# Patient Record
Sex: Male | Born: 1955 | Race: White | Hispanic: No | Smoking: Current every day smoker
Health system: Southern US, Community
[De-identification: ages and names within clinical notes are randomized; demographics above are authoritative.]

## PROBLEM LIST (undated history)

## (undated) DIAGNOSIS — N400 Enlarged prostate without lower urinary tract symptoms: Secondary | ICD-10-CM

## (undated) DIAGNOSIS — F419 Anxiety disorder, unspecified: Secondary | ICD-10-CM

## (undated) DIAGNOSIS — J45909 Unspecified asthma, uncomplicated: Secondary | ICD-10-CM

## (undated) HISTORY — PX: COLONOSCOPY: SHX174

## (undated) HISTORY — PX: TONSILLECTOMY: SUR1361

## (undated) HISTORY — PX: WISDOM TOOTH EXTRACTION: SHX21

## (undated) HISTORY — PX: CHOLECYSTECTOMY: SHX55

## (undated) HISTORY — PX: OTHER SURGICAL HISTORY: SHX169

---

## 2013-12-29 ENCOUNTER — Ambulatory Visit
Admission: RE | Admit: 2013-12-29 | Discharge: 2013-12-29 | Disposition: A | Discharge status: Home / Self Care | Payer: BC Managed Care – PPO | Source: Ambulatory Visit | Attending: Family Medicine | Admitting: Family Medicine

## 2013-12-29 ENCOUNTER — Other Ambulatory Visit: Payer: Self-pay | Admitting: Family Medicine

## 2013-12-29 DIAGNOSIS — R0789 Other chest pain: Secondary | ICD-10-CM

## 2015-08-23 DIAGNOSIS — R972 Elevated prostate specific antigen [PSA]: Secondary | ICD-10-CM | POA: Diagnosis not present

## 2015-08-31 MED FILL — CITALOPRAM HBR 20 MG TABLET: 20 | 30 days supply | Qty: 30 | Fill #0

## 2015-09-30 MED FILL — CITALOPRAM HBR 20 MG TABLET: 20 | 30 days supply | Qty: 30 | Fill #1

## 2015-10-13 MED FILL — TAMSULOSIN HCL 0.4 MG CAP: 0.4 | 90 days supply | Qty: 90 | Fill #0

## 2015-10-25 MED FILL — CITALOPRAM HBR 20 MG TABLET: 20 | 30 days supply | Qty: 30 | Fill #2

## 2015-10-26 DIAGNOSIS — R972 Elevated prostate specific antigen [PSA]: Secondary | ICD-10-CM | POA: Diagnosis not present

## 2015-10-26 DIAGNOSIS — N5201 Erectile dysfunction due to arterial insufficiency: Secondary | ICD-10-CM | POA: Diagnosis not present

## 2015-10-26 DIAGNOSIS — R350 Frequency of micturition: Secondary | ICD-10-CM | POA: Diagnosis not present

## 2015-10-26 DIAGNOSIS — N401 Enlarged prostate with lower urinary tract symptoms: Secondary | ICD-10-CM | POA: Diagnosis not present

## 2015-11-23 DIAGNOSIS — Z0120 Encounter for dental examination and cleaning without abnormal findings: Secondary | ICD-10-CM | POA: Diagnosis not present

## 2015-12-07 DIAGNOSIS — K1370 Unspecified lesions of oral mucosa: Secondary | ICD-10-CM | POA: Diagnosis not present

## 2015-12-07 MED FILL — CITALOPRAM HBR 20 MG TABLET: 20 | 30 days supply | Qty: 30 | Fill #3

## 2015-12-21 DIAGNOSIS — K146 Glossodynia: Secondary | ICD-10-CM | POA: Diagnosis not present

## 2015-12-21 DIAGNOSIS — B370 Candidal stomatitis: Secondary | ICD-10-CM | POA: Diagnosis not present

## 2015-12-21 MED FILL — NYSTATIN 100,000 UNITS/ML S: 100000 | 6 days supply | Qty: 120 | Fill #0

## 2015-12-23 DIAGNOSIS — R972 Elevated prostate specific antigen [PSA]: Secondary | ICD-10-CM | POA: Diagnosis not present

## 2015-12-26 MED FILL — NYSTATIN 100,000 UNITS/ML S: 100000 | 6 days supply | Qty: 120 | Fill #1

## 2015-12-30 DIAGNOSIS — R351 Nocturia: Secondary | ICD-10-CM | POA: Diagnosis not present

## 2015-12-30 DIAGNOSIS — N5201 Erectile dysfunction due to arterial insufficiency: Secondary | ICD-10-CM | POA: Diagnosis not present

## 2015-12-30 DIAGNOSIS — N401 Enlarged prostate with lower urinary tract symptoms: Secondary | ICD-10-CM | POA: Diagnosis not present

## 2015-12-30 DIAGNOSIS — R972 Elevated prostate specific antigen [PSA]: Secondary | ICD-10-CM | POA: Diagnosis not present

## 2016-01-12 MED FILL — CITALOPRAM HBR 20 MG TABLET: 20 | 30 days supply | Qty: 30 | Fill #4

## 2016-01-24 MED FILL — TAMSULOSIN HCL 0.4 MG CAP: 0.4 | 90 days supply | Qty: 90 | Fill #1

## 2016-02-08 DIAGNOSIS — Z0120 Encounter for dental examination and cleaning without abnormal findings: Secondary | ICD-10-CM | POA: Diagnosis not present
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
## 2016-02-15 MED FILL — CITALOPRAM HBR 20 MG TABLET: 20 | 30 days supply | Qty: 30 | Fill #5

## 2016-02-23 DIAGNOSIS — G5712 Meralgia paresthetica, left lower limb: Secondary | ICD-10-CM | POA: Diagnosis not present

## 2016-02-23 DIAGNOSIS — Z23 Encounter for immunization: Secondary | ICD-10-CM | POA: Diagnosis not present

## 2016-02-23 MED FILL — levoFLOXacin 500 MG TABS: 500 | 3 days supply | Qty: 3 | Fill #0

## 2016-02-23 MED FILL — predniSONE 10 MG TABS: 10 | 8 days supply | Qty: 20 | Fill #0

## 2016-02-24 MED FILL — traMADol HCL 50 MG TABS: 50 | 7 days supply | Qty: 30 | Fill #0

## 2016-02-27 DIAGNOSIS — R972 Elevated prostate specific antigen [PSA]: Secondary | ICD-10-CM | POA: Diagnosis not present

## 2016-02-27 DIAGNOSIS — D075 Carcinoma in situ of prostate: Secondary | ICD-10-CM | POA: Diagnosis not present

## 2016-03-01 MED FILL — predniSONE 20 MG TABS: 20 | 12 days supply | Qty: 24 | Fill #0

## 2016-03-21 MED FILL — CITALOPRAM HBR 20 MG TABLET: 20 | 30 days supply | Qty: 30 | Fill #6

## 2016-04-04 MED FILL — CIALIS 5 MG TABLET: 5 | 30 days supply | Qty: 30 | Fill #0

## 2016-04-23 MED FILL — TAMSULOSIN HCL 0.4 MG CAP: 0.4 | 90 days supply | Qty: 90 | Fill #2

## 2016-04-23 MED FILL — CITALOPRAM HBR 20 MG TABLET: 20 | 30 days supply | Qty: 30 | Fill #7

## 2016-05-09 MED FILL — CIALIS 5 MG TABLET: 5 | 30 days supply | Qty: 30 | Fill #1

## 2016-05-23 MED FILL — CITALOPRAM HBR 20 MG TABLET: 20 | 30 days supply | Qty: 30 | Fill #8

## 2016-06-15 MED FILL — CIALIS 5 MG TABLET: 5 | 30 days supply | Qty: 30 | Fill #2

## 2016-06-25 MED FILL — CITALOPRAM HBR 20 MG TABLET: 20 | 30 days supply | Qty: 30 | Fill #9

## 2016-07-11 DIAGNOSIS — K146 Glossodynia: Secondary | ICD-10-CM | POA: Diagnosis not present

## 2016-07-11 DIAGNOSIS — Z13220 Encounter for screening for lipoid disorders: Secondary | ICD-10-CM | POA: Diagnosis not present

## 2016-07-11 DIAGNOSIS — M79646 Pain in unspecified finger(s): Secondary | ICD-10-CM | POA: Diagnosis not present

## 2016-07-11 DIAGNOSIS — Z0000 Encounter for general adult medical examination without abnormal findings: Secondary | ICD-10-CM | POA: Diagnosis not present

## 2016-07-11 DIAGNOSIS — N529 Male erectile dysfunction, unspecified: Secondary | ICD-10-CM | POA: Diagnosis not present

## 2016-07-11 DIAGNOSIS — N400 Enlarged prostate without lower urinary tract symptoms: Secondary | ICD-10-CM | POA: Diagnosis not present

## 2016-07-11 DIAGNOSIS — Z1211 Encounter for screening for malignant neoplasm of colon: Secondary | ICD-10-CM | POA: Diagnosis not present

## 2016-07-11 DIAGNOSIS — F17200 Nicotine dependence, unspecified, uncomplicated: Secondary | ICD-10-CM | POA: Diagnosis not present

## 2016-07-17 MED FILL — TAMSULOSIN HCL 0.4 MG CAP: 0.4 | 90 days supply | Qty: 90 | Fill #3

## 2016-07-17 MED FILL — CIALIS 5 MG TABLET: 5 | 30 days supply | Qty: 30 | Fill #3

## 2016-07-25 MED FILL — CITALOPRAM HBR 20 MG TABLET: 20 | 30 days supply | Qty: 30 | Fill #10

## 2016-07-30 DIAGNOSIS — N5201 Erectile dysfunction due to arterial insufficiency: Secondary | ICD-10-CM | POA: Diagnosis not present

## 2016-07-30 DIAGNOSIS — R972 Elevated prostate specific antigen [PSA]: Secondary | ICD-10-CM | POA: Diagnosis not present

## 2016-07-30 DIAGNOSIS — R350 Frequency of micturition: Secondary | ICD-10-CM | POA: Diagnosis not present

## 2016-07-30 DIAGNOSIS — N401 Enlarged prostate with lower urinary tract symptoms: Secondary | ICD-10-CM | POA: Diagnosis not present

## 2016-08-02 DIAGNOSIS — Z0000 Encounter for general adult medical examination without abnormal findings: Secondary | ICD-10-CM | POA: Diagnosis not present

## 2016-08-02 DIAGNOSIS — M79646 Pain in unspecified finger(s): Secondary | ICD-10-CM | POA: Diagnosis not present

## 2016-08-02 DIAGNOSIS — Z13220 Encounter for screening for lipoid disorders: Secondary | ICD-10-CM | POA: Diagnosis not present

## 2016-08-22 MED FILL — CITALOPRAM HBR 20 MG TABLET: 20 | 30 days supply | Qty: 30 | Fill #11

## 2016-08-22 MED FILL — CIALIS 5 MG TABLET: 5 | 30 days supply | Qty: 30 | Fill #4

## 2016-09-24 MED FILL — CIALIS 5 MG TABLET: 5 | 30 days supply | Qty: 30 | Fill #5

## 2016-09-26 MED FILL — CITALOPRAM HBR 20 MG TABLET: 20 | 30 days supply | Qty: 30 | Fill #0

## 2016-10-23 MED FILL — CIALIS 5 MG TABLET: 5 | 30 days supply | Qty: 30 | Fill #6

## 2016-10-23 MED FILL — TAMSULOSIN HCL 0.4 MG CAP: 0.4 | 90 days supply | Qty: 90 | Fill #0

## 2016-10-23 MED FILL — CITALOPRAM HBR 20 MG TABLET: 20 | 30 days supply | Qty: 30 | Fill #1

## 2016-11-26 DIAGNOSIS — N5201 Erectile dysfunction due to arterial insufficiency: Secondary | ICD-10-CM | POA: Diagnosis not present

## 2016-11-27 MED FILL — CIALIS 5 MG TABLET: 5 | 30 days supply | Qty: 30 | Fill #0

## 2016-11-27 MED FILL — CITALOPRAM HBR 20 MG TABLET: 20 | 30 days supply | Qty: 30 | Fill #0

## 2016-12-31 MED FILL — TADALAFIL 5 MG TABS: 5 | 30 days supply | Qty: 30 | Fill #1

## 2016-12-31 MED FILL — CITALOPRAM HBR 20 MG TABLET: 20 | 30 days supply | Qty: 30 | Fill #1

## 2017-01-22 MED FILL — TAMSULOSIN HCL 0.4 MG CAP: 0.4 | 90 days supply | Qty: 90 | Fill #1

## 2017-02-05 MED FILL — TADALAFIL 5 MG TABS: 5 | 30 days supply | Qty: 30 | Fill #2

## 2017-02-05 MED FILL — CITALOPRAM HBR 20 MG TABLET: 20 | 30 days supply | Qty: 30 | Fill #2

## 2017-03-11 MED FILL — CITALOPRAM HBR 20 MG TABLET: 20 | 30 days supply | Qty: 30 | Fill #3

## 2017-03-11 MED FILL — TADALAFIL 5 MG TABS: 5 | 30 days supply | Qty: 30 | Fill #3

## 2017-04-17 MED FILL — CITALOPRAM HBR 20 MG TABLET: 20 | 30 days supply | Qty: 30 | Fill #4

## 2017-04-17 MED FILL — TAMSULOSIN HCL 0.4 MG CAP: 0.4 | 90 days supply | Qty: 90 | Fill #2

## 2017-05-20 MED FILL — CITALOPRAM HBR 20 MG TABLET: 20 | 30 days supply | Qty: 30 | Fill #5

## 2017-05-22 DIAGNOSIS — N5201 Erectile dysfunction due to arterial insufficiency: Secondary | ICD-10-CM | POA: Diagnosis not present

## 2017-05-22 DIAGNOSIS — N401 Enlarged prostate with lower urinary tract symptoms: Secondary | ICD-10-CM | POA: Diagnosis not present

## 2017-05-22 DIAGNOSIS — R350 Frequency of micturition: Secondary | ICD-10-CM | POA: Diagnosis not present

## 2017-05-22 DIAGNOSIS — R972 Elevated prostate specific antigen [PSA]: Secondary | ICD-10-CM | POA: Diagnosis not present

## 2017-05-22 MED FILL — FINASTERIDE 5 MG TABLET: 5 | 90 days supply | Qty: 90 | Fill #0

## 2017-06-24 MED FILL — CITALOPRAM HBR 20 MG TABLET: 20 | 30 days supply | Qty: 30 | Fill #6

## 2017-07-22 DIAGNOSIS — Z1159 Encounter for screening for other viral diseases: Secondary | ICD-10-CM | POA: Diagnosis not present

## 2017-07-22 DIAGNOSIS — N400 Enlarged prostate without lower urinary tract symptoms: Secondary | ICD-10-CM | POA: Diagnosis not present

## 2017-07-22 DIAGNOSIS — Z1211 Encounter for screening for malignant neoplasm of colon: Secondary | ICD-10-CM | POA: Diagnosis not present

## 2017-07-22 DIAGNOSIS — F17200 Nicotine dependence, unspecified, uncomplicated: Secondary | ICD-10-CM | POA: Diagnosis not present

## 2017-07-22 DIAGNOSIS — Z13220 Encounter for screening for lipoid disorders: Secondary | ICD-10-CM | POA: Diagnosis not present

## 2017-07-22 DIAGNOSIS — K146 Glossodynia: Secondary | ICD-10-CM | POA: Diagnosis not present

## 2017-07-22 DIAGNOSIS — M79646 Pain in unspecified finger(s): Secondary | ICD-10-CM | POA: Diagnosis not present

## 2017-07-22 DIAGNOSIS — Z0000 Encounter for general adult medical examination without abnormal findings: Secondary | ICD-10-CM | POA: Diagnosis not present

## 2017-07-22 DIAGNOSIS — N529 Male erectile dysfunction, unspecified: Secondary | ICD-10-CM | POA: Diagnosis not present

## 2017-07-22 MED FILL — MELOXICAM 15 MG TABLET: 15 | 90 days supply | Qty: 90 | Fill #0

## 2017-07-28 MED FILL — CITALOPRAM HBR 20 MG TABLET: 20 | 30 days supply | Qty: 30 | Fill #7

## 2017-07-29 MED FILL — TAMSULOSIN HCL 0.4 MG CAP: 0.4 | 90 days supply | Qty: 90 | Fill #0

## 2017-08-28 MED FILL — CITALOPRAM HBR 20 MG TABLET: 20 | 30 days supply | Qty: 30 | Fill #8

## 2017-09-11 ENCOUNTER — Encounter: Payer: Self-pay | Admitting: Gastroenterology

## 2017-09-28 MED FILL — CITALOPRAM HBR 20 MG TABLET: 20 | 30 days supply | Qty: 30 | Fill #9

## 2017-10-28 MED FILL — TAMSULOSIN HCL 0.4 MG CAP: 0.4 | 90 days supply | Qty: 90 | Fill #0

## 2017-10-28 MED FILL — CITALOPRAM HBR 20 MG TABLET: 20 | 30 days supply | Qty: 30 | Fill #10

## 2017-11-04 ENCOUNTER — Ambulatory Visit (AMBULATORY_SURGERY_CENTER): Payer: Self-pay

## 2017-11-04 VITALS — Ht 68.0 in | Wt 166.4 lb

## 2017-11-04 DIAGNOSIS — Z1211 Encounter for screening for malignant neoplasm of colon: Secondary | ICD-10-CM

## 2017-11-04 MED ORDER — NA SULFATE-K SULFATE-MG SULF 17.5-3.13-1.6 GM/177ML PO SOLN: 1.0000 | Freq: Once | ORAL | 0 refills | Status: AC

## 2017-11-04 NOTE — Progress Notes (Signed)
No egg or soy allergy known to patient  No issues with past sedation with any surgeries  or procedures, no intubation problems  No diet pills per patient No home 02 use per patient  No blood thinners per patient  Pt denies issues with constipation  No A fib or A flutter  EMMI video sent to pt's e mail  

## 2017-11-06 ENCOUNTER — Encounter: Payer: Self-pay | Admitting: Gastroenterology

## 2017-11-12 MED FILL — MELOXICAM 15 MG TABLET: 15 | 90 days supply | Qty: 90 | Fill #1

## 2017-11-15 MED FILL — SUPREP BOWEL PREP KIT: 17.5-3.13-1 | 1 days supply | Qty: 354 | Fill #0

## 2017-11-18 ENCOUNTER — Ambulatory Visit (AMBULATORY_SURGERY_CENTER): Payer: 59 | Admitting: Gastroenterology

## 2017-11-18 ENCOUNTER — Encounter: Payer: Self-pay | Admitting: Gastroenterology

## 2017-11-18 VITALS — BP 114/63 | HR 58 | Temp 98.9°F | Resp 18 | Ht 68.0 in | Wt 166.0 lb

## 2017-11-18 DIAGNOSIS — D122 Benign neoplasm of ascending colon: Secondary | ICD-10-CM | POA: Diagnosis not present

## 2017-11-18 DIAGNOSIS — Z1211 Encounter for screening for malignant neoplasm of colon: Secondary | ICD-10-CM

## 2017-11-18 DIAGNOSIS — N400 Enlarged prostate without lower urinary tract symptoms: Secondary | ICD-10-CM | POA: Diagnosis not present

## 2017-11-18 MED ORDER — SODIUM CHLORIDE 0.9 % IV SOLN: 500.0000 mL | Freq: Once | INTRAVENOUS | Status: DC

## 2017-11-18 NOTE — Op Note (Signed)
[place]  Patient Name: Keith Beard Procedure Date: 11/18/2017 8:40 AM MRN: [id number] Endoscopist: Remo Lipps P. Havery Moros , MD Age: 62 Referring MD:  Date of Birth: [birth year] Gender: Male Account #: [id number] Procedure:                Colonoscopy Indications:              Screening for colorectal malignant neoplasm Medicines:                Monitored Anesthesia Care Procedure:                Pre-Anesthesia Assessment:                           - Prior to the procedure, a History and Physical                            was performed, and patient medications and                            allergies were reviewed. The patient's tolerance of                            previous anesthesia was also reviewed. The risks                            and benefits of the procedure and the sedation                            options and risks were discussed with the patient.                            All questions were answered, and informed consent                            was obtained. Prior Anticoagulants: The patient has                            taken no previous anticoagulant or antiplatelet                            agents. ASA Grade Assessment: II - A patient with                            mild systemic disease. After reviewing the risks                            and benefits, the patient was deemed in                            satisfactory condition to undergo the procedure.                           After obtaining informed consent, the colonoscope  was passed under direct vision. Throughout the                            procedure, the patient's blood pressure, pulse, and                            oxygen saturations were monitored continuously. The                            Colonoscope was introduced through the anus and                            advanced to the the cecum, identified by                            appendiceal orifice  and ileocecal valve. The                            colonoscopy was performed without difficulty. The                            patient tolerated the procedure well. The quality                            of the bowel preparation was adequate. The                            ileocecal valve, appendiceal orifice, and rectum                            were photographed. Scope In: 8:54:43 AM Scope Out: 9:15:13 AM Scope Withdrawal Time: 0 hours 17 minutes 45 seconds  Total Procedure Duration: 0 hours 20 minutes 30 seconds  Findings:                 Skin tags were found on perianal exam.                           Many small-mouthed diverticula were found in the                            left colon.                           A 5 mm polyp was found in the ascending colon. The                            polyp was flat. The polyp was removed with a cold                            snare. Resection and retrieval were complete.                           Internal hemorrhoids were found during retroflexion.  The exam was otherwise without abnormality. Complications:            No immediate complications. Estimated blood loss:                            Minimal. Estimated Blood Loss:     Estimated blood loss was minimal. Impression:               - Perianal skin tags found on perianal exam.                           - Diverticulosis in the left colon.                           - One 5 mm polyp in the ascending colon, removed                            with a cold snare. Resected and retrieved.                           - Internal hemorrhoids.                           - The examination was otherwise normal. Recommendation:           - Patient has a contact number available for                            emergencies. The signs and symptoms of potential                            delayed complications were discussed with the                            patient. Return to normal  activities tomorrow.                            Written discharge instructions were provided to the                            patient.                           - Resume previous diet.                           - Repeat colonoscopy date to be determined after                            pending pathology results are reviewed for                            surveillance.                           - Continue present medications. Remo Lipps P. Durant Scibilia, MD 11/18/2017 9:18:26 AM This report has been signed electronically.

## 2017-11-18 NOTE — Progress Notes (Signed)
Called to room to assist during endoscopic procedure.  Patient ID and intended procedure confirmed with present staff. Received instructions for my participation in the procedure from the performing physician.  

## 2017-11-18 NOTE — Progress Notes (Signed)
Report to PACU, RN, vss, BBS= Clear.  

## 2017-11-18 NOTE — Patient Instructions (Signed)
YOU HAD AN ENDOSCOPIC PROCEDURE TODAY AT THE  ENDOSCOPY CENTER:   Refer to the procedure report that was given to you for any specific questions about what was found during the examination.  If the procedure report does not answer your questions, please call your gastroenterologist to clarify.  If you requested that your care partner not be given the details of your procedure findings, then the procedure report has been included in a sealed envelope for you to review at your convenience later.  YOU SHOULD EXPECT: Some feelings of bloating in the abdomen. Passage of more gas than usual.  Walking can help get rid of the air that was put into your GI tract during the procedure and reduce the bloating. If you had a lower endoscopy (such as a colonoscopy or flexible sigmoidoscopy) you may notice spotting of blood in your stool or on the toilet paper. If you underwent a bowel prep for your procedure, you may not have a normal bowel movement for a few days.  Please Note:  You might notice some irritation and congestion in your nose or some drainage.  This is from the oxygen used during your procedure.  There is no need for concern and it should clear up in a day or so.  SYMPTOMS TO REPORT IMMEDIATELY:   Following lower endoscopy (colonoscopy or flexible sigmoidoscopy):  Excessive amounts of blood in the stool  Significant tenderness or worsening of abdominal pains  Swelling of the abdomen that is new, acute  Fever of 100F or higher  Please see handouts on polyps, diverticulosis, and hemorrhoids.  For urgent or emergent issues, a gastroenterologist can be reached at any hour by calling [phone].   DIET:  We do recommend a small meal at first, but then you may proceed to your regular diet.  Drink plenty of fluids but you should avoid alcoholic beverages for 24 hours.  ACTIVITY:  You should plan to take it easy for the rest of today and you should NOT DRIVE or use heavy machinery until  tomorrow (because of the sedation medicines used during the test).    FOLLOW UP: Our staff will call the number listed on your records the next business day following your procedure to check on you and address any questions or concerns that you may have regarding the information given to you following your procedure. If we do not reach you, we will leave a message.  However, if you are feeling well and you are not experiencing any problems, there is no need to return our call.  We will assume that you have returned to your regular daily activities without incident.  If any biopsies were taken you will be contacted by phone or by letter within the next 1-3 weeks.  Please call us at [phone] if you have not heard about the biopsies in 3 weeks.    SIGNATURES/CONFIDENTIALITY: You and/or your care partner have signed paperwork which will be entered into your electronic medical record.  These signatures attest to the fact that that the information above on your After Visit Summary has been reviewed and is understood.  Full responsibility of the confidentiality of this discharge information lies with you and/or your care-partner.   Thank you for allowing us to provide your healthcare today.  

## 2017-11-19 ENCOUNTER — Telehealth: Payer: Self-pay

## 2017-11-19 ENCOUNTER — Telehealth: Payer: Self-pay | Admitting: *Deleted

## 2017-11-19 NOTE — Telephone Encounter (Signed)
Left message on f/u call 

## 2017-11-19 NOTE — Telephone Encounter (Signed)
Attempted to reach pt. With follow-up call following endoscopic procedure 11/18/2017.  LM on pt. Voice mail.  Will try to reach pt. Again later today.

## 2017-11-20 ENCOUNTER — Encounter: Payer: Self-pay | Admitting: Gastroenterology

## 2017-11-22 ENCOUNTER — Encounter (HOSPITAL_COMMUNITY): Payer: Self-pay | Admitting: *Deleted

## 2017-11-22 ENCOUNTER — Other Ambulatory Visit: Payer: Self-pay

## 2017-11-22 ENCOUNTER — Emergency Department (HOSPITAL_COMMUNITY)
Admission: EM | Admit: 2017-11-22 | Discharge: 2017-11-22 | Disposition: A | Discharge status: Home / Self Care | Payer: 59 | Attending: Emergency Medicine | Admitting: Emergency Medicine

## 2017-11-22 DIAGNOSIS — F17210 Nicotine dependence, cigarettes, uncomplicated: Secondary | ICD-10-CM | POA: Diagnosis not present

## 2017-11-22 DIAGNOSIS — R339 Retention of urine, unspecified: Secondary | ICD-10-CM | POA: Insufficient documentation

## 2017-11-22 DIAGNOSIS — R1030 Lower abdominal pain, unspecified: Secondary | ICD-10-CM | POA: Insufficient documentation

## 2017-11-22 DIAGNOSIS — R8271 Bacteriuria: Secondary | ICD-10-CM | POA: Diagnosis not present

## 2017-11-22 DIAGNOSIS — Z79899 Other long term (current) drug therapy: Secondary | ICD-10-CM | POA: Insufficient documentation

## 2017-11-22 DIAGNOSIS — R338 Other retention of urine: Secondary | ICD-10-CM | POA: Diagnosis not present

## 2017-11-22 LAB — URINALYSIS, ROUTINE W REFLEX MICROSCOPIC
Bilirubin Urine: NEGATIVE
Glucose, UA: NEGATIVE mg/dL
Hgb urine dipstick: NEGATIVE
Ketones, ur: NEGATIVE mg/dL
Leukocytes, UA: NEGATIVE
Nitrite: NEGATIVE
Protein, ur: NEGATIVE mg/dL
Specific Gravity, Urine: 1.014 (ref 1.005–1.030)
pH: 5 (ref 5.0–8.0)

## 2017-11-22 LAB — CBC WITH DIFFERENTIAL/PLATELET
Basophils Absolute: 0 10*3/uL (ref 0.0–0.1)
Basophils Relative: 0 %
Eosinophils Absolute: 0.1 10*3/uL (ref 0.0–0.7)
Eosinophils Relative: 1 %
HCT: 41.5 % (ref 39.0–52.0)
Hemoglobin: 14.1 g/dL (ref 13.0–17.0)
Lymphocytes Relative: 19 %
Lymphs Abs: 1.7 10*3/uL (ref 0.7–4.0)
MCH: 31.8 pg (ref 26.0–34.0)
MCHC: 34 g/dL (ref 30.0–36.0)
MCV: 93.5 fL (ref 78.0–100.0)
Monocytes Absolute: 0.5 10*3/uL (ref 0.1–1.0)
Monocytes Relative: 5 %
Neutro Abs: 6.5 10*3/uL (ref 1.7–7.7)
Neutrophils Relative %: 75 %
Platelets: 263 10*3/uL (ref 150–400)
RBC: 4.44 MIL/uL (ref 4.22–5.81)
RDW: 13.6 % (ref 11.5–15.5)
WBC: 8.7 10*3/uL (ref 4.0–10.5)

## 2017-11-22 LAB — BASIC METABOLIC PANEL
Anion gap: 7 (ref 5–15)
BUN: 23 mg/dL — ABNORMAL HIGH (ref 6–20)
CO2: 24 mmol/L (ref 22–32)
Calcium: 9.2 mg/dL (ref 8.9–10.3)
Chloride: 110 mmol/L (ref 98–111)
Creatinine, Ser: 0.84 mg/dL (ref 0.61–1.24)
GFR calc Af Amer: >60 mL/min (ref >60)
GFR calc non Af Amer: >60 mL/min (ref >60)
Glucose, Bld: 105 mg/dL — ABNORMAL HIGH (ref 70–99)
Potassium: 4.5 mmol/L (ref 3.5–5.1)
Sodium: 141 mmol/L (ref 135–145)

## 2017-11-22 NOTE — Discharge Instructions (Signed)
You have decided not to keep the foley in. Please try to urinate frequently.   See your urologist next week   Return to ER if you are unable to urinate, blood clots in the urine. If you have urinary retention again, we likely will need to put a foley and may need to leave it in for several days.

## 2017-11-22 NOTE — ED Notes (Signed)
Pt given water for fluid challenge 

## 2017-11-22 NOTE — ED Provider Notes (Signed)
[facility] DEPT Provider Note   CSN: [id number] Arrival date & time: 11/22/17  0640     History   Chief Complaint Chief Complaint  Patient presents with  . Urinary Retention    HPI Keith Beard is a 62 y.o. male here with urinary retention.  Patient has a history of BPH and follows up with Dr. Diona Beard.  Since last night, he was unable to urinate.  He last urinated around 9 PM but try to get up at midnight was unable to urinate.  States that he has severe lower abdominal pain.  Denies any fevers or chills or vomiting.  Patient states that he never had a Foley catheter in the past.   The history is provided by the patient.    History reviewed. No pertinent past medical history.  There are no active problems to display for this patient.   Past Surgical History:  Procedure Laterality Date  . CHOLECYSTECTOMY    . TONSILLECTOMY    . WISDOM TOOTH EXTRACTION     20 years ago        Home Medications    Prior to Admission medications   Medication Sig Start Date End Date Taking? Authorizing Provider  citalopram (CELEXA) 20 MG tablet  10/28/17   [provider]  meloxicam (MOBIC) 7.5 MG tablet Take 7.5 mg by mouth daily.    [provider]  Multiple Vitamin (MULTIVITAMIN) tablet Take 1 tablet by mouth daily.    [provider]  tadalafil (CIALIS) 10 MG tablet Take 10 mg by mouth daily as needed for erectile dysfunction.    [provider]  tamsulosin (FLOMAX) 0.4 MG CAPS capsule  10/28/17   [provider]  vitamin B-12 (CYANOCOBALAMIN) 100 MCG tablet Take 100 mcg by mouth daily.    [provider]  vitamin C (ASCORBIC ACID) 500 MG tablet Take 500 mg by mouth daily.    [provider]  vitamin E 400 UNIT capsule Take 400 Units by mouth daily.    [provider]    Family History Family History  Problem Relation Age of Onset  . Hypertension Father   . Colon cancer Neg  Hx   . Colon polyps Neg Hx   . Esophageal cancer Neg Hx   . Stomach cancer Neg Hx   . Rectal cancer Neg Hx     Social History Social History   Tobacco Use  . Smoking status: Current Every Day Smoker    Packs/day: 1.00  . Smokeless tobacco: Never Used  Substance Use Topics  . Alcohol use: Yes    Alcohol/week: 4.0 - 5.0 standard drinks    Types: 4 - 5 Cans of beer per week  . Drug use: Not on file     Allergies   Patient has no known allergies.   Review of Systems Review of Systems  Gastrointestinal: Positive for abdominal pain.  Genitourinary: Positive for difficulty urinating.  All other systems reviewed and are negative.    Physical Exam Updated Vital Signs BP 137/87   Pulse 90   Temp (!) 97.1 F (36.2 C) (Oral)   Resp 16   SpO2 99%   Physical Exam  Constitutional: He is oriented to person, place, and time. He appears well-developed.  Unable   HENT:  Head: Normocephalic.  Eyes: Pupils are equal, round, and reactive to light. Conjunctivae and EOM are normal.  Neck: Normal range of motion. Neck supple.  Cardiovascular: Normal rate, regular rhythm and normal  heart sounds.  Pulmonary/Chest: Effort normal and breath sounds normal.  Abdominal: Soft.  Suprapubic tenderness, + enlarged bladder   Musculoskeletal: Normal range of motion.  Neurological: He is alert and oriented to person, place, and time.  Skin: Skin is warm.  Psychiatric: He has a normal mood and affect.  Nursing note and vitals reviewed.    ED Treatments / Results  Labs (all labs ordered are listed, but only abnormal results are displayed) Labs Reviewed  URINE CULTURE  URINALYSIS, ROUTINE W REFLEX MICROSCOPIC  CBC WITH DIFFERENTIAL/PLATELET  BASIC METABOLIC PANEL    EKG None  Radiology No results found.  Procedures Procedures (including critical care time)  Medications Ordered in ED Medications - No data to display   Initial Impression / Assessment and Plan / ED Course  I  have reviewed the triage vital signs and the nursing notes.  Pertinent labs & imaging results that were available during my care of the patient were reviewed by me and considered in my medical decision making (see chart for details).     Keith Beard is a 62 y.o. male here with urinary retention. Likely from BPH. Will get bladder scan and if its greater than 250 cc, will place foley. Will check chemistry as well.   9:30 AM Foley placed. Around 1 L came out. UA and chemistry unremarkable. Patient requests foley removal as it is very uncomfortable. I told him that he will likely go back into retention from his BPH. However, he is clear that he wants it removed and understand risks and benefits. Will do voiding trial.   10:49 AM Tolerated PO fluids. Able to void about 50 cc, slightly blood tinged (expected after foley catheter removal). He still request that no foley be placed and he will see Dr. Marthann Beard in the office. Told him that if he is unable to urinate, he will need to return to the ED and foley will need to be placed again.    Final Clinical Impressions(s) / ED Diagnoses   Final diagnoses:  None    ED Discharge Orders    None       Drenda Freeze, MD 11/22/17 1050

## 2017-11-22 NOTE — ED Triage Notes (Signed)
Pt says since about 9pm last night he has only urinated a very small amount

## 2017-11-23 LAB — URINE CULTURE: Culture: NO GROWTH

## 2017-11-27 DIAGNOSIS — N5201 Erectile dysfunction due to arterial insufficiency: Secondary | ICD-10-CM | POA: Diagnosis not present

## 2017-11-27 DIAGNOSIS — R338 Other retention of urine: Secondary | ICD-10-CM | POA: Diagnosis not present

## 2017-11-27 DIAGNOSIS — R350 Frequency of micturition: Secondary | ICD-10-CM | POA: Diagnosis not present

## 2017-11-27 DIAGNOSIS — R972 Elevated prostate specific antigen [PSA]: Secondary | ICD-10-CM | POA: Diagnosis not present

## 2017-11-27 DIAGNOSIS — N401 Enlarged prostate with lower urinary tract symptoms: Secondary | ICD-10-CM | POA: Diagnosis not present

## 2017-12-04 MED FILL — CITALOPRAM HBR 20 MG TABLET: 20 | 30 days supply | Qty: 30 | Fill #0

## 2018-01-03 MED FILL — CITALOPRAM HBR 20 MG TABLET: 20 | 30 days supply | Qty: 30 | Fill #1

## 2018-01-30 MED FILL — TAMSULOSIN HCL 0.4 MG CAP: 0.4 | 90 days supply | Qty: 90 | Fill #1

## 2018-01-30 MED FILL — CITALOPRAM HBR 20 MG TABLET: 20 | 30 days supply | Qty: 30 | Fill #2

## 2018-03-04 MED FILL — CITALOPRAM HBR 20 MG TABLET: 20 | 30 days supply | Qty: 30 | Fill #3

## 2018-03-20 DIAGNOSIS — Z23 Encounter for immunization: Secondary | ICD-10-CM | POA: Diagnosis not present

## 2018-04-07 MED FILL — CITALOPRAM HBR 20 MG TABLET: 20 | 30 days supply | Qty: 30 | Fill #4

## 2018-04-08 MED FILL — DICLOFENAC SODIUM 1 % GEL: 1 | 37 days supply | Qty: 300 | Fill #0

## 2018-05-01 MED FILL — TAMSULOSIN HCL 0.4 MG CAP: 0.4 | 90 days supply | Qty: 90 | Fill #2

## 2018-05-05 MED FILL — CITALOPRAM HBR 20 MG TABLET: 20 | 30 days supply | Qty: 30 | Fill #5

## 2018-06-04 MED FILL — CITALOPRAM HBR 20 MG TABLET: 20 | 30 days supply | Qty: 30 | Fill #6

## 2018-06-04 MED FILL — MELOXICAM 15 MG TABLET: 15 | 90 days supply | Qty: 90 | Fill #2

## 2018-07-07 MED FILL — CITALOPRAM HBR 20 MG TABLET: 20 | 30 days supply | Qty: 30 | Fill #7

## 2018-07-31 DIAGNOSIS — F17200 Nicotine dependence, unspecified, uncomplicated: Secondary | ICD-10-CM | POA: Diagnosis not present

## 2018-07-31 DIAGNOSIS — Z13220 Encounter for screening for lipoid disorders: Secondary | ICD-10-CM | POA: Diagnosis not present

## 2018-07-31 DIAGNOSIS — N400 Enlarged prostate without lower urinary tract symptoms: Secondary | ICD-10-CM | POA: Diagnosis not present

## 2018-07-31 DIAGNOSIS — Z0000 Encounter for general adult medical examination without abnormal findings: Secondary | ICD-10-CM | POA: Diagnosis not present

## 2018-07-31 DIAGNOSIS — M79646 Pain in unspecified finger(s): Secondary | ICD-10-CM | POA: Diagnosis not present

## 2018-07-31 DIAGNOSIS — Z1159 Encounter for screening for other viral diseases: Secondary | ICD-10-CM | POA: Diagnosis not present

## 2018-07-31 DIAGNOSIS — Z1211 Encounter for screening for malignant neoplasm of colon: Secondary | ICD-10-CM | POA: Diagnosis not present

## 2018-07-31 DIAGNOSIS — K146 Glossodynia: Secondary | ICD-10-CM | POA: Diagnosis not present

## 2018-07-31 DIAGNOSIS — N529 Male erectile dysfunction, unspecified: Secondary | ICD-10-CM | POA: Diagnosis not present

## 2018-08-13 MED FILL — CITALOPRAM HBR 20 MG TABLET: 20 | 30 days supply | Qty: 30 | Fill #8

## 2018-08-13 MED FILL — TAMSULOSIN HCL 0.4 MG CAP: 0.4 | 90 days supply | Qty: 90 | Fill #3

## 2018-08-19 DIAGNOSIS — Z0000 Encounter for general adult medical examination without abnormal findings: Secondary | ICD-10-CM | POA: Diagnosis not present

## 2018-08-19 DIAGNOSIS — Z1159 Encounter for screening for other viral diseases: Secondary | ICD-10-CM | POA: Diagnosis not present

## 2018-08-19 DIAGNOSIS — Z13220 Encounter for screening for lipoid disorders: Secondary | ICD-10-CM | POA: Diagnosis not present

## 2018-09-10 MED FILL — CITALOPRAM HBR 20 MG TABLET: 20 | 30 days supply | Qty: 30 | Fill #9

## 2018-11-11 MED FILL — CITALOPRAM HBR 20 MG TABLET: 20 | 30 days supply | Qty: 30 | Fill #0

## 2018-11-14 MED FILL — TAMSULOSIN HCL 0.4 MG CAP: 0.4 | 90 days supply | Qty: 90 | Fill #0

## 2018-12-16 MED FILL — AMOX-CLAV 500-125 MG TABLET: 500-125 | 10 days supply | Qty: 20 | Fill #0

## 2018-12-16 MED FILL — CITALOPRAM HBR 20 MG TABLET: 20 | 30 days supply | Qty: 30 | Fill #0

## 2018-12-20 MED FILL — FLUARIX QUADRIVALENT 0.5 ML: 0.5 | 1 days supply | Qty: 1 | Fill #0

## 2019-01-14 MED FILL — CITALOPRAM HBR 20 MG TABLET: 20 | 30 days supply | Qty: 30 | Fill #1

## 2019-02-13 MED FILL — CITALOPRAM HBR 20 MG TABLET: 20 | 30 days supply | Qty: 30 | Fill #2

## 2019-02-13 MED FILL — TAMSULOSIN HCL 0.4 MG CAP: 0.4 | 90 days supply | Qty: 90 | Fill #0

## 2019-04-13 MED FILL — CITALOPRAM HBR 20 MG TABLET: 20 | 30 days supply | Qty: 30 | Fill #4

## 2019-04-16 DIAGNOSIS — M545 Low back pain: Secondary | ICD-10-CM | POA: Diagnosis not present

## 2019-04-16 MED FILL — CYCLOBENZAPRINE HCL 10 MG T: 10 | 15 days supply | Qty: 15 | Fill #0

## 2019-04-22 ENCOUNTER — Encounter: Payer: Self-pay | Admitting: Physical Therapy

## 2019-04-22 ENCOUNTER — Ambulatory Visit: Payer: 59 | Attending: Family Medicine | Admitting: Physical Therapy

## 2019-04-22 ENCOUNTER — Other Ambulatory Visit: Payer: Self-pay

## 2019-04-22 DIAGNOSIS — M545 Low back pain, unspecified: Secondary | ICD-10-CM

## 2019-04-22 DIAGNOSIS — M62830 Muscle spasm of back: Secondary | ICD-10-CM | POA: Diagnosis not present

## 2019-04-23 ENCOUNTER — Encounter: Payer: Self-pay | Admitting: Physical Therapy

## 2019-04-23 NOTE — Therapy (Signed)
[place], [place], [address] Phone: [phone]   Fax:  [phone]  Physical Therapy Evaluation  Patient Details  Name: Keith Beard MRN: [id number] Date of Birth: 03/04/1956 Referring Provider (PT): Dr Eual Fines    Encounter Date: 04/22/2019  PT End of Session - 04/23/19 0727    Visit Number  1    Number of Visits  12    Date for PT Re-Evaluation  06/04/19    PT Start Time  [id number]    PT Stop Time  1459    PT Time Calculation (min)  44 min    Activity Tolerance  Patient tolerated treatment well    Behavior During Therapy  [facility] for tasks assessed/performed       History reviewed. No pertinent past medical history.  Past Surgical History:  Procedure Laterality Date  . CHOLECYSTECTOMY    . TONSILLECTOMY    . WISDOM TOOTH EXTRACTION     20 years ago    There were no vitals filed for this visit.   Subjective Assessment - 04/22/19 1419    Subjective  Patient had an acute onset of lower back pain about 5 weeks ago. He has had this problem before but int he past it has gone away.    Pertinent History  smoker    How long can you sit comfortably?  Stiffens later in the day    How long can you stand comfortably?  standing isn;t bad    How long can you walk comfortably?  can feel it when he walks; faovers the left side    Diagnostic tests  Nothing    Patient Stated Goals  to have less pain    Currently in Pain?  Yes    Pain Score  6     Pain Location  Back    Pain Orientation  Left    Pain Descriptors / Indicators  Aching    Pain Type  Chronic pain    Pain Radiating Towards  pain into the buttock    Pain Onset  More than a month ago    Pain Frequency  Constant    Aggravating Factors   as the day goes on; sleeping position    Pain Relieving Factors  positioning;    Effect of Pain on Daily Activities  pain as the day goes on                    Objective measurements completed  on examination: See above findings.      [place] [facility] PT Treatment/Exercise - 04/23/19 0001      Lumbar Exercises: Stretches   Piriformis Stretch  3 reps;20 seconds    Other Lumbar Stretch Exercise  tennis ball trigger point release       Lumbar Exercises: Supine   AB Set Limitations  reviewed breathing with gym activity     Clam Limitations  x20 yellow to decrease post needle soreness       Manual Therapy   Manual therapy comments  40% limitation on FOTO        Trigger Point Dry Needling - 04/23/19 0001    Consent Given?  Yes    Education Handout Provided  Yes    Muscles Treated Back/Hip  Gluteus medius    Dry Needling Comments  3 spots using a 30x75 needle inleft glut medius     Gluteus Medius Response  Twitch response elicited  PT Education - 04/23/19 0726    Education Details  reviewed HEP and symptom management    Person(s) Educated  Patient    Methods  Explanation;Demonstration;Tactile cues;Verbal cues    Comprehension  Verbalized understanding;Returned demonstration;Verbal cues required;Tactile cues required       PT Short Term Goals - 04/23/19 0811      PT SHORT TERM GOAL #1   Title  Patient will perfrom full lumbar extension without radiating pain into his buttock    Time  3    Period  Weeks    Status  New    Target Date  05/14/19      PT SHORT TERM GOAL #2   Title  Patient will increase left gross LE strength to 5/5    Time  3    Period  Weeks    Status  New    Target Date  05/14/19      PT SHORT TERM GOAL #3   Title  Patient will demonstrate full end range left hip flexion    Time  3    Period  Weeks    Status  New    Target Date  05/14/19        PT Long Term Goals - 04/23/19 0823      PT LONG TERM GOAL #1   Title  Patient will sleep in whatever position he chooses without lower back pain    Baseline  can only sleep on his left side    Time  6    Period  Weeks    Status  New    Target Date  06/04/19      PT LONG TERM GOAL  #2   Title  Patient will stand at work without increased low back pain    Time  6    Period  Weeks    Status  New    Target Date  06/04/19      PT LONG TERM GOAL #3   Title  Patient will demonstrate a 26% limitation on FOTO    Baseline  40%    Time  6    Period  Weeks    Status  New    Target Date  06/04/19             Plan - 04/23/19 0727    Clinical Impression Statement  Patient is a 64 year old male with left sided lower back pain. He has increased pain with extension and imporved pain with fleixon although he feels pulling at end range flexion. He has a large spasm in hi sleft glut medius/max are. He has mild weakness compared ot the right side. He has pain with end range passive hip flexion. He would benefit from skilled therapy to reduce spasm and pain an to devrelope a plan to manage symptoms in the future    Personal Factors and Comorbidities  Comorbidity 1    Comorbidities  smoker    Examination-Activity Limitations  Bend;Reach Overhead    Examination-Participation Restrictions  Driving;Church    Stability/Clinical Decision Making  Evolving/Moderate complexity   increasing pain radiating into his buttock   Clinical Decision Making  Low    Rehab Potential  Excellent    PT Frequency  1x / week    PT Duration  6 weeks    PT Treatment/Interventions  ADLs/Self Care Home Management;Cryotherapy;Iontophoresis '4mg'$ /ml Dexamethasone;Electrical Stimulation;Ultrasound;Moist Heat;DME Instruction;Gait training;Functional mobility training;Therapeutic activities;Neuromuscular re-education;Patient/family education;Manual techniques;Therapeutic exercise;Passive range of motion;Dry needling;Taping    PT Next  Visit Plan  consider LAD; TPDN; Trigger point release to gluteal, does a loit fo gym exercises inegrate core breathing into his current gym program; review lifting technique ; add hamstring stretching; consider bridging and quadruped progression if time permits.    PT Home Exercise  Plan  piriformis stretch; tennis ball release, reviewwed breahting for gym exercises; supine clamshell for psot needle soreness    Consulted and Agree with Plan of Care  Patient       Patient will benefit from skilled therapeutic intervention in order to improve the following deficits and impairments:  Decreased endurance, Increased muscle spasms, Decreased range of motion, Decreased activity tolerance, Decreased strength, Pain  Visit Diagnosis: Acute left-sided low back pain without sciatica  Muscle spasm of back     Problem List There are no problems to display for this patient.   Carney Living  PT DPT  04/23/2019, 8:31 AM  [facility] [address] [place], [place], [address] Phone: [phone]   Fax:  [phone]  Name: Keith Beard MRN: [id number] Date of Birth: Aug 24, 1955

## 2019-05-04 ENCOUNTER — Encounter: Payer: Self-pay | Admitting: Physical Therapy

## 2019-05-04 ENCOUNTER — Other Ambulatory Visit: Payer: Self-pay

## 2019-05-04 ENCOUNTER — Ambulatory Visit: Payer: 59 | Admitting: Physical Therapy

## 2019-05-04 DIAGNOSIS — M545 Low back pain, unspecified: Secondary | ICD-10-CM

## 2019-05-04 DIAGNOSIS — M62830 Muscle spasm of back: Secondary | ICD-10-CM | POA: Diagnosis not present

## 2019-05-04 NOTE — Therapy (Signed)
[place], [place], [address] Phone: [phone]   Fax:  [phone]  Physical Therapy Treatment  Patient Details  Name: Keith Beard MRN: [id number] Date of Birth: 17-Jul-1955 Referring Provider (PT): Dr Eual Fines    Encounter Date: 05/04/2019  PT End of Session - 05/04/19 1724    Visit Number  2    Number of Visits  12    Date for PT Re-Evaluation  06/04/19    PT Start Time  [id number]    PT Stop Time  1717    PT Time Calculation (min)  46 min    Activity Tolerance  Patient tolerated treatment well    Behavior During Therapy  [facility] for tasks assessed/performed       History reviewed. No pertinent past medical history.  Past Surgical History:  Procedure Laterality Date  . CHOLECYSTECTOMY    . TONSILLECTOMY    . WISDOM TOOTH EXTRACTION     20 years ago    There were no vitals filed for this visit.  Subjective Assessment - 05/04/19 1639    Subjective  " I think I am doing about the same, I have pain that is going down to the L leg into the calf. I have been consistent with the gym"    Patient Stated Goals  to have less pain    Currently in Pain?  Yes    Pain Score  1                        [facility] Adult PT Treatment/Exercise - 05/04/19 0001      Exercises   Exercises  Lumbar      Lumbar Exercises: Stretches   Standing Extension  2 reps;15 reps   pt did report centralization but no changesin low back pain   Press Ups  2 reps;10 reps    Piriformis Stretch  2 reps;30 seconds;Left      Lumbar Exercises: Aerobic   Nustep  L5 x 5 min UE/LE      Manual Therapy   Manual Therapy  Other (comment)    Manual therapy comments  skilled palaption and monitoring of pt throughout TPDN    Other Manual Therapy  tack and stretch of the L piriformis       Trigger Point Dry Needling - 05/04/19 0001    Consent Given?  Yes    Education Handout Provided  Yes    Muscles Treated Back/Hip   Piriformis    Piriformis Response  Twitch response elicited;Palpable increased muscle length   L side          PT Education - 05/04/19 1724    Education Details  anatomy of the back and disc biomechanics as it relates to referred pain    Methods  Explanation;Verbal cues    Comprehension  Verbalized understanding;Verbal cues required       PT Short Term Goals - 04/23/19 0811      PT SHORT TERM GOAL #1   Title  Patient will perfrom full lumbar extension without radiating pain into his buttock    Time  3    Period  Weeks    Status  New    Target Date  05/14/19      PT SHORT TERM GOAL #2   Title  Patient will increase left gross LE strength to 5/5    Time  3    Period  Weeks    Status  New    Target Date  05/14/19      PT SHORT TERM GOAL #3   Title  Patient will demonstrate full end range left hip flexion    Time  3    Period  Weeks    Status  New    Target Date  05/14/19        PT Long Term Goals - 04/23/19 0823      PT LONG TERM GOAL #1   Title  Patient will sleep in whatever position he chooses without lower back pain    Baseline  can only sleep on his left side    Time  6    Period  Weeks    Status  New    Target Date  06/04/19      PT LONG TERM GOAL #2   Title  Patient will stand at work without increased low back pain    Time  6    Period  Weeks    Status  New    Target Date  06/04/19      PT LONG TERM GOAL #3   Title  Patient will demonstrate a 26% limitation on FOTO    Baseline  40%    Time  6    Period  Weeks    Status  New    Target Date  06/04/19            Plan - 05/04/19 1725    Clinical Impression Statement  pt reports limited progress since the last session noting he has been mostly consistent with all exercises. repeated extension pt did note centralization but continued pain located at the piriformis/ glute med, trialed prone press up with no LLE referred symptoms but signficant pain in the hip. TPDN was performed on the  piriformis with mulitple twitches and reproduced of concordant pain. end of session he noted soreness in th ehip and mild relief of the RLE symptoms.    PT Treatment/Interventions  ADLs/Self Care Home Management;Cryotherapy;Iontophoresis '4mg'$ /ml Dexamethasone;Electrical Stimulation;Ultrasound;Moist Heat;DME Instruction;Gait training;Functional mobility training;Therapeutic activities;Neuromuscular re-education;Patient/family education;Manual techniques;Therapeutic exercise;Passive range of motion;Dry needling;Taping    PT Next Visit Plan  consider LAD; TPDN; Trigger point release to gluteal, does a loit fo gym exercises inegrate core breathing into his current gym program; review lifting technique ; add hamstring stretching; consider bridging and quadruped progression if time permits.    PT Home Exercise Plan  piriformis stretch; tennis ball release, reviewwed breahting for gym exercises; supine clamshell for psot needle soreness    Consulted and Agree with Plan of Care  Patient       Patient will benefit from skilled therapeutic intervention in order to improve the following deficits and impairments:  Decreased endurance, Increased muscle spasms, Decreased range of motion, Decreased activity tolerance, Decreased strength, Pain  Visit Diagnosis: Acute left-sided low back pain without sciatica  Muscle spasm of back     Problem List There are no problems to display for this patient.  Starr Lake PT, DPT, LAT, ATC  05/04/19  5:30 PM      [place], [place], [address] Phone: [phone]   Fax:  [phone]  Name: Keith Beard MRN: [id number] Date of Birth: January 22, 1956

## 2019-05-06 MED FILL — CYCLOBENZAPRINE HCL 10 MG T: 10 | 15 days supply | Qty: 15 | Fill #1

## 2019-05-12 ENCOUNTER — Other Ambulatory Visit: Payer: Self-pay

## 2019-05-12 ENCOUNTER — Ambulatory Visit: Payer: 59 | Attending: Family Medicine | Admitting: Physical Therapy

## 2019-05-12 ENCOUNTER — Encounter: Payer: Self-pay | Admitting: Physical Therapy

## 2019-05-12 DIAGNOSIS — M545 Low back pain, unspecified: Secondary | ICD-10-CM

## 2019-05-12 DIAGNOSIS — M62830 Muscle spasm of back: Secondary | ICD-10-CM | POA: Diagnosis not present

## 2019-05-12 NOTE — Therapy (Signed)
[place], [place], [address] Phone: [phone]   Fax:  [phone]  Physical Therapy Treatment  Patient Details  Name: Keith Beard MRN: [id number] Date of Birth: [birth year] Referring Provider (PT): Dr Eual Fines    Encounter Date: 05/12/2019  PT End of Session - 05/12/19 1613    Visit Number  3    Number of Visits  12    Date for PT Re-Evaluation  06/04/19    PT Start Time  1612    PT Stop Time  1655    PT Time Calculation (min)  43 min    Activity Tolerance  Patient tolerated treatment well    Behavior During Therapy  [facility] for tasks assessed/performed       History reviewed. No pertinent past medical history.  Past Surgical History:  Procedure Laterality Date  . CHOLECYSTECTOMY    . TONSILLECTOMY    . WISDOM TOOTH EXTRACTION     20 years ago    There were no vitals filed for this visit.  Subjective Assessment - 05/12/19 1613    Subjective  "I am doing much better, I still have some pain at 1/10, and down to the the leg"    Currently in Pain?  Yes    Pain Score  1     Pain Orientation  Left    Pain Descriptors / Indicators  Aching    Pain Type  Chronic pain    Pain Onset  More than a month ago    Pain Frequency  Intermittent    Aggravating Factors   as the day progresses    Pain Relieving Factors  re-positiong                       [facility] Adult PT Treatment/Exercise - 05/12/19 0001      Lumbar Exercises: Aerobic   Nustep  L5 x 5 min UE/LE      Lumbar Exercises: Standing   Other Standing Lumbar Exercises  step up on 6 inch step 1 x 10      Lumbar Exercises: Seated   Sit to Stand  10 reps   from lowered table x 2 sets     Manual Therapy   Manual Therapy  Joint mobilization;Soft tissue mobilization    Manual therapy comments  skilled palaption and monitoring of pt throughout TPDN    Joint Mobilization  LAD grade V LLE only    Soft tissue mobilization  IASTM  along glute med/ max and piriformis    Other Manual Therapy  tack and stretch of the L piriformis       Trigger Point Dry Needling - 05/12/19 0001    Consent Given?  Yes    Education Handout Provided  Previously provided    Muscles Treated Back/Hip  Piriformis;Gluteus maximus;Gluteus medius    Gluteus Medius Response  Twitch response elicited;Palpable increased muscle length    Gluteus Maximus Response  Twitch response elicited;Palpable increased muscle length    Piriformis Response  Twitch response elicited;Palpable increased muscle length           PT Education - 05/12/19 1706    Education Details  updated HEP for hip strengthening    Person(s) Educated  Patient    Methods  Explanation;Verbal cues;Handout    Comprehension  Verbalized understanding;Verbal cues required       PT Short Term Goals - 04/23/19 0811      PT SHORT TERM GOAL #  1   Title  Patient will perfrom full lumbar extension without radiating pain into his buttock    Time  3    Period  Weeks    Status  New    Target Date  05/14/19      PT SHORT TERM GOAL #2   Title  Patient will increase left gross LE strength to 5/5    Time  3    Period  Weeks    Status  New    Target Date  05/14/19      PT SHORT TERM GOAL #3   Title  Patient will demonstrate full end range left hip flexion    Time  3    Period  Weeks    Status  New    Target Date  05/14/19        PT Long Term Goals - 04/23/19 0823      PT LONG TERM GOAL #1   Title  Patient will sleep in whatever position he chooses without lower back pain    Baseline  can only sleep on his left side    Time  6    Period  Weeks    Status  New    Target Date  06/04/19      PT LONG TERM GOAL #2   Title  Patient will stand at work without increased low back pain    Time  6    Period  Weeks    Status  New    Target Date  06/04/19      PT LONG TERM GOAL #3   Title  Patient will demonstrate a 26% limitation on FOTO    Baseline  40%    Time  6     Period  Weeks    Status  New    Target Date  06/04/19            Plan - 05/12/19 1659    Clinical Impression Statement  pt notes improvement in pain and referred symptoms reporting it as more intermittent. Continued TPDN focusing onthe piriformis, glute med/ min and maximus followed with IASTM techniques and stretching. worked in hip abductor/ extensor strength and pt fatigues quickly but noted no pain with navigating a 6 inch step which previously caused aggrivation.    PT Treatment/Interventions  ADLs/Self Care Home Management;Cryotherapy;Iontophoresis 4mg /ml Dexamethasone;Electrical Stimulation;Ultrasound;Moist Heat;DME Instruction;Gait training;Functional mobility training;Therapeutic activities;Neuromuscular re-education;Patient/family education;Manual techniques;Therapeutic exercise;Passive range of motion;Dry needling;Taping    PT Next Visit Plan  consider LAD; TPDN; Trigger point release to gluteal, does a loit fo gym exercises inegrate core breathing into his current gym program; review lifting technique, hip strengthening.    PT Home Exercise Plan  piriformis stretch; tennis ball release, reviewwed breahting for gym exercises; supine clamshell for psot needle soreness, sidelying hip abduction, sit to stand.    Consulted and Agree with Plan of Care  Patient       Patient will benefit from skilled therapeutic intervention in order to improve the following deficits and impairments:  Decreased endurance, Increased muscle spasms, Decreased range of motion, Decreased activity tolerance, Decreased strength, Pain  Visit Diagnosis: Acute left-sided low back pain without sciatica  Muscle spasm of back     Problem List There are no problems to display for this patient.  Starr Lake PT, DPT, LAT, ATC  05/12/19  5:09 PM      [place] [facility] [address] [place], [place], [address] Phone: [phone]   Fax:  [phone]  Name: Keith Beard MRN: [id number] Date of Birth: 02/17/1956

## 2019-05-15 MED FILL — TAMSULOSIN HCL 0.4 MG CAP: 0.4 | 90 days supply | Qty: 90 | Fill #1

## 2019-05-15 MED FILL — CITALOPRAM HBR 20 MG TABLET: 20 | 30 days supply | Qty: 30 | Fill #5

## 2019-05-19 ENCOUNTER — Encounter: Payer: Self-pay | Admitting: Physical Therapy

## 2019-05-19 ENCOUNTER — Other Ambulatory Visit: Payer: Self-pay

## 2019-05-19 ENCOUNTER — Ambulatory Visit: Payer: 59 | Admitting: Physical Therapy

## 2019-05-19 DIAGNOSIS — M545 Low back pain, unspecified: Secondary | ICD-10-CM

## 2019-05-19 DIAGNOSIS — M62830 Muscle spasm of back: Secondary | ICD-10-CM

## 2019-05-19 NOTE — Therapy (Addendum)
[place], [place], [address] Phone: [phone]   Fax:  [phone]  Physical Therapy Treatment / Discharge  Patient Details  Name: Keith Beard MRN: [id number] Date of Birth: Feb 12, 1956 Referring Provider (PT): Dr Eual Fines    Encounter Date: 05/19/2019  PT End of Session - 05/19/19 1630    Visit Number  4    Number of Visits  12    Date for PT Re-Evaluation  06/04/19    PT Start Time  1630    PT Stop Time  1711    PT Time Calculation (min)  41 min    Activity Tolerance  Patient tolerated treatment well    Behavior During Therapy  [facility] for tasks assessed/performed       History reviewed. No pertinent past medical history.  Past Surgical History:  Procedure Laterality Date  . CHOLECYSTECTOMY    . TONSILLECTOMY    . WISDOM TOOTH EXTRACTION     20 years ago    There were no vitals filed for this visit.  Subjective Assessment - 05/19/19 1630    Subjective  "the L side is doing better, I was able to get in and out of the car with no issues"    Currently in Pain?  Yes    Pain Score  0-No pain    Pain Orientation  Left    Pain Descriptors / Indicators  Aching    Pain Type  Chronic pain    Pain Onset  More than a month ago    Aggravating Factors   unsure                       [facility] Adult PT Treatment/Exercise - 05/19/19 0001      Lumbar Exercises: Machines for Strengthening   Leg Press  2 x 15 60#   cues to avoid locking the knees   Other Lumbar Machine Exercise  hip abduction 2 x 10 12.5#      Lumbar Exercises: Standing   Other Standing Lumbar Exercises  hip abduction/ extension 2 x 12 with red theraband    Other Standing Lumbar Exercises  dead lift 2 x 12 30# 1 x a from 7 inch step, 1 x from floor   1 x 10 withblack theraband      Lumbar Exercises: Seated   Sit to Stand  15 reps             PT Education - 05/19/19 1710    Education Details  reviewed HEP  and updated today for hip strength    Methods  Explanation;Verbal cues;Handout    Comprehension  Verbalized understanding;Verbal cues required       PT Short Term Goals - 04/23/19 0811      PT SHORT TERM GOAL #1   Title  Patient will perfrom full lumbar extension without radiating pain into his buttock    Time  3    Period  Weeks    Status  New    Target Date  05/14/19      PT SHORT TERM GOAL #2   Title  Patient will increase left gross LE strength to 5/5    Time  3    Period  Weeks    Status  New    Target Date  05/14/19      PT SHORT TERM GOAL #3   Title  Patient will demonstrate full end range left hip flexion  Time  3    Period  Weeks    Status  New    Target Date  05/14/19        PT Long Term Goals - 04/23/19 0823      PT LONG TERM GOAL #1   Title  Patient will sleep in whatever position he chooses without lower back pain    Baseline  can only sleep on his left side    Time  6    Period  Weeks    Status  New    Target Date  06/04/19      PT LONG TERM GOAL #2   Title  Patient will stand at work without increased low back pain    Time  6    Period  Weeks    Status  New    Target Date  06/04/19      PT LONG TERM GOAL #3   Title  Patient will demonstrate a 26% limitation on FOTO    Baseline  40%    Time  6    Period  Weeks    Status  New    Target Date  06/04/19            Plan - 05/19/19 1714    Clinical Impression Statement  pt reports significant improvement in pain in the hip and mild to no LE referral. Focused session on hip strengthening with emphasis on hip extension and abduction which he does fatigue quickly. plan to see pt back in 2 weeks to assess progress and determine if more PT is necessary.    PT Treatment/Interventions  ADLs/Self Care Home Management;Cryotherapy;Iontophoresis 73m/ml Dexamethasone;Electrical Stimulation;Ultrasound;Moist Heat;DME Instruction;Gait training;Functional mobility training;Therapeutic  activities;Neuromuscular re-education;Patient/family education;Manual techniques;Therapeutic exercise;Passive range of motion;Dry needling;Taping    PT Next Visit Plan  TPDN, hip strengthening    PT Home Exercise Plan  piriformis stretch; tennis ball release, reviewwed breahting for gym exercises; supine clamshell for psot needle soreness, sidelying hip abduction, sit to stand, standing hip abduction, extension, dead lift    Consulted and Agree with Plan of Care  Patient       Patient will benefit from skilled therapeutic intervention in order to improve the following deficits and impairments:  Decreased endurance, Increased muscle spasms, Decreased range of motion, Decreased activity tolerance, Decreased strength, Pain  Visit Diagnosis: Acute left-sided low back pain without sciatica  Muscle spasm of back     Problem List There are no problems to display for this patient.  KStarr LakePT, DPT, LAT, ATC  05/19/19  5:25 PM      [facility] [address]Phone: [phone]  Fax:  [phone] Name: Keith LallMRN: [id number] of Birth: 1June 05, 1957      PHYSICAL THERAPY DISCHARGE SUMMARY  Visits from Start of Care: 4  Current functional level related to goals / functional outcomes: See goals,    Remaining deficits: As of last attended visit he was doing well, and called canceling his last visit stating he no longer needed PT.   Education / Equipment: HEP, theraband, posture.   Plan: Patient agrees to discharge.  Patient goals were met. Patient is being discharged due to being pleased with the current functional level.  ?????          Keith Beard PT, DPT, LAT, ATC  06/10/19  4:26 PM

## 2019-05-22 ENCOUNTER — Ambulatory Visit: Payer: 59 | Attending: Internal Medicine

## 2019-05-22 DIAGNOSIS — Z23 Encounter for immunization: Secondary | ICD-10-CM

## 2019-05-22 NOTE — Progress Notes (Signed)
   Covid-19 Vaccination Clinic  Name:  Keith Beard    MRN: [id number] DOB: 04/17/1955  05/22/2019  Keith Beard was observed post Covid-19 immunization for 15 minutes without incident. He was provided with Vaccine Information Sheet and instruction to access the V-Safe system.   Keith Beard was instructed to call 911 with any severe reactions post vaccine: Marland Kitchen Difficulty breathing  . Swelling of face and throat  . A fast heartbeat  . A bad rash all over body  . Dizziness and weakness   Immunizations Administered    Name Date Dose VIS Date Route   Pfizer COVID-19 Vaccine 05/22/2019  2:16 PM 0.3 mL 02/20/2019 Intramuscular   Manufacturer: [place]   Lot: [id number]   [place]: [id number]

## 2019-06-03 ENCOUNTER — Ambulatory Visit: Payer: 59 | Admitting: Physical Therapy

## 2019-06-08 MED FILL — CITALOPRAM HBR 20 MG TABLET: 20 | 30 days supply | Qty: 30 | Fill #6

## 2019-06-15 ENCOUNTER — Ambulatory Visit: Payer: 59

## 2019-06-17 ENCOUNTER — Ambulatory Visit: Payer: 59 | Attending: Internal Medicine

## 2019-06-17 DIAGNOSIS — Z23 Encounter for immunization: Secondary | ICD-10-CM

## 2019-06-17 NOTE — Progress Notes (Signed)
   Covid-19 Vaccination Clinic  Name:  Keith Beard    MRN: [id number] DOB: Jul 10, 1955  06/17/2019  Keith Beard was observed post Covid-19 immunization for 15 minutes without incident. He was provided with Vaccine Information Sheet and instruction to access the V-Safe system.   Keith Beard was instructed to call 911 with any severe reactions post vaccine: Marland Kitchen Difficulty breathing  . Swelling of face and throat  . A fast heartbeat  . A bad rash all over body  . Dizziness and weakness   Immunizations Administered    Name Date Dose VIS Date Route   Pfizer COVID-19 Vaccine 06/17/2019  9:51 AM 0.3 mL 02/20/2019 Intramuscular   Manufacturer: [employer], [employer]   Lot: [id number]   [place]: [id number]

## 2019-06-18 DIAGNOSIS — H40013 Open angle with borderline findings, low risk, bilateral: Secondary | ICD-10-CM | POA: Diagnosis not present

## 2019-06-18 DIAGNOSIS — H18513 Endothelial corneal dystrophy, bilateral: Secondary | ICD-10-CM | POA: Diagnosis not present

## 2019-06-18 DIAGNOSIS — H35033 Hypertensive retinopathy, bilateral: Secondary | ICD-10-CM | POA: Diagnosis not present

## 2019-06-18 DIAGNOSIS — H2513 Age-related nuclear cataract, bilateral: Secondary | ICD-10-CM | POA: Diagnosis not present

## 2019-06-24 DIAGNOSIS — R3912 Poor urinary stream: Secondary | ICD-10-CM | POA: Diagnosis not present

## 2019-06-24 DIAGNOSIS — R972 Elevated prostate specific antigen [PSA]: Secondary | ICD-10-CM | POA: Diagnosis not present

## 2019-06-24 DIAGNOSIS — N401 Enlarged prostate with lower urinary tract symptoms: Secondary | ICD-10-CM | POA: Diagnosis not present

## 2019-06-24 DIAGNOSIS — N5201 Erectile dysfunction due to arterial insufficiency: Secondary | ICD-10-CM | POA: Diagnosis not present

## 2019-07-20 ENCOUNTER — Other Ambulatory Visit: Payer: Self-pay

## 2019-07-20 ENCOUNTER — Ambulatory Visit (INDEPENDENT_AMBULATORY_CARE_PROVIDER_SITE_OTHER): Payer: 59 | Admitting: Urology

## 2019-07-20 ENCOUNTER — Encounter: Payer: Self-pay | Admitting: Urology

## 2019-07-20 VITALS — BP 121/72 | HR 69 | Ht 69.0 in | Wt 165.0 lb

## 2019-07-20 DIAGNOSIS — N401 Enlarged prostate with lower urinary tract symptoms: Secondary | ICD-10-CM

## 2019-07-20 DIAGNOSIS — R972 Elevated prostate specific antigen [PSA]: Secondary | ICD-10-CM

## 2019-07-20 DIAGNOSIS — N138 Other obstructive and reflux uropathy: Secondary | ICD-10-CM | POA: Diagnosis not present

## 2019-07-20 LAB — BLADDER SCAN AMB NON-IMAGING

## 2019-07-20 NOTE — Patient Instructions (Signed)

## 2019-07-20 NOTE — Progress Notes (Signed)
07/20/19 2:39 PM   Keith Beard [birth year] [id number]  CC: Discuss HoLEP  HPI: I saw Keith Beard for evaluation of HOLEP today from Dr. Diona Fanti.  Keith Beard is a healthy 64 year old male with a long history of elevated PSA and BPH symptoms.  Keith Beard underwent a prostate biopsy in 2017 for PSA of 4 which showed a 111 g prostate but no evidence of malignancy.  Keith Beard has been on Flomax and Cialis long-term.  Keith Beard also has erectile dysfunction that is refractory to both penile injections and Cialis.  Keith Beard also has some ventral curvature of the penis with erections.  Keith Beard was offered finasteride by Dr. Diona Fanti, but Keith Beard deferred secondary to side effect profile.  Keith Beard has ongoing bothersome urinary symptoms of weak stream and feeling of incomplete emptying, urgency, frequency, and perform CIC 4 times per week when Keith Beard is unable to urinate.  Keith Beard has never had urodynamics before or cystoscopy.  Last PSA in March 2019 was elevated at 7.67 which was elevated from 6.6 in May 2018.  There is no other cross-sectional imaging to review.  DRE with Dr. Diona Fanti last month was notable for 100 g prostate, no nodules or suspicious masses.  There is no family history of prostate cancer.  IPSS score today is 24, with quality of life mixed.  PVR 130 mL.   Surgical History: Past Surgical History:  Procedure Laterality Date  . CHOLECYSTECTOMY    . TONSILLECTOMY    . WISDOM TOOTH EXTRACTION     20 years ago    Family History: Family History  Problem Relation Age of Onset  . Hypertension Father   . Colon cancer Neg Hx   . Colon polyps Neg Hx   . Esophageal cancer Neg Hx   . Stomach cancer Neg Hx   . Rectal cancer Neg Hx   . Prostate cancer Neg Hx   . Kidney cancer Neg Hx   . Bladder Cancer Neg Hx     Social History:  reports that Keith Beard has been smoking. Keith Beard has been smoking about 1.00 pack per day. Keith Beard has never used smokeless tobacco. Keith Beard reports current alcohol use of about 4.0 - 5.0 standard drinks of alcohol per week.  No history on file for drug.  Physical Exam: BP 121/72   Pulse 69   Ht '5\' 9"'$  (1.753 m)   Wt 165 lb (74.8 kg)   BMI 24.37 kg/m    Constitutional:  Alert and oriented, No acute distress. Cardiovascular: No clubbing, cyanosis, or edema. Respiratory: Normal respiratory effort, no increased work of breathing. GI: Abdomen is soft, nontender, nondistended, no abdominal masses  Laboratory Data: PSA history reviewed, see HPI  Pertinent Imaging: None to review  Assessment & Plan:   In summary, Keith Beard is a healthy 64 year old male with BPH and incomplete emptying requiring CIC multiple times per week and bothersome urinary symptoms with IPSS score of 24.  Keith Beard also has an elevated PSA of 7.7 from March 2019, and last prostate biopsy was performed in October 2017 for an elevated PSA of 4.  We reviewed the implications of an elevated PSA and the uncertainty surrounding it. In general, a man's PSA increases with age and is produced by both normal and cancerous prostate tissue. The differential diagnosis for elevated PSA includes BPH, prostate cancer, infection, recent intercourse/ejaculation, recent urethroscopic manipulation (foley placement/cystoscopy) or trauma, and prostatitis.   Management of an elevated PSA can include observation or prostate biopsy and we discussed this in detail. Our goal is to  detect clinically significant prostate cancers, and manage with either active surveillance, surgery, or radiation for localized disease. Risks of prostate biopsy include bleeding, infection (including life threatening sepsis), pain, and lower urinary symptoms. Hematuria, hematospermia, and blood in the stool are all common after biopsy and can persist up to 4 weeks.   We discussed the risks and benefits of HoLEP at length.  The procedure requires general anesthesia and takes 2 to 3 hours, and a holmium laser is used to enucleate the prostate and push this tissue into the bladder.  A morcellator is then used to  remove this tissue, which is sent for pathology.  The vast majority of patients are able to discharge the same day with a catheter in place for 2 to 3 days, and will follow-up in clinic for a voiding trial.  Approximately 5% of patients will be admitted overnight to monitor the urine, or if they have multiple co-morbidities.  We specifically discussed the risks of bleeding, infection, retrograde ejaculation, temporary urgency and urge incontinence, very low risk of long-term incontinence, pathologic evaluation of prostate tissue and possible detection of prostate cancer or other malignancy, and possible need for additional procedures.  We discussed alternatives including chronic Foley catheter, continuing CIC, or observation with risk of upper tract damage, worsening bladder function, recurrent UTIs, gross hematuria.  PSA today, call with results: Pursue prostate biopsy if significantly elevated Cysto next available Schedule HoLEP  [name], MD 07/20/2019  [place] [address] [place], [place] [address] [phone]

## 2019-07-21 ENCOUNTER — Telehealth: Payer: Self-pay

## 2019-07-21 LAB — PSA TOTAL (REFLEX TO FREE): Prostate Specific Ag, Serum: 8.6 ng/mL — ABNORMAL HIGH (ref 0.0–4.0)

## 2019-07-21 LAB — FPSA% REFLEX
% FREE PSA: 22.6 %
PSA, FREE: 1.94 ng/mL

## 2019-07-21 NOTE — Telephone Encounter (Signed)
-----   Message from [name], MD sent at 07/21/2019  9:58 AM EDT ----- PSA remains slightly elevated but stable, likely secondary to very large prostate. Would not recommend prostate biopsy, keep follow up for cysto in clinic and then HoLEP.   [name], MD 07/21/2019

## 2019-07-21 NOTE — Telephone Encounter (Signed)
mychart notification sent 

## 2019-07-29 ENCOUNTER — Ambulatory Visit (INDEPENDENT_AMBULATORY_CARE_PROVIDER_SITE_OTHER): Payer: 59 | Admitting: Urology

## 2019-07-29 ENCOUNTER — Other Ambulatory Visit: Payer: Self-pay

## 2019-07-29 ENCOUNTER — Encounter: Payer: Self-pay | Admitting: Urology

## 2019-07-29 VITALS — BP 138/86 | HR 70 | Ht 69.0 in | Wt 163.0 lb

## 2019-07-29 DIAGNOSIS — N401 Enlarged prostate with lower urinary tract symptoms: Secondary | ICD-10-CM | POA: Diagnosis not present

## 2019-07-29 DIAGNOSIS — N138 Other obstructive and reflux uropathy: Secondary | ICD-10-CM

## 2019-07-29 NOTE — Progress Notes (Signed)
Cystoscopy Procedure Note:  Indication: BPH with outlet obstruction  After informed consent and discussion of the procedure and its risks, Keith Beard was positioned and prepped in the standard fashion. Cystoscopy was performed with a flexible cystoscope. The urethra, bladder neck and entire bladder was visualized in a standard fashion. The prostate was massive with a high bladder neck and obstructing lateral lobes.  Retroflexion demonstrating significant intravesical protrusion.  No bladder stones or suspicious bladder lesions.  Findings: Massive prostate, no bladder abnormalities  Assessment and Plan: 64 year old male with long history of BPH and urinary symptoms who perform CIC multiple times per week for retention.  We discussed the risks and benefits of HoLEP at length.  The procedure requires general anesthesia and takes 2 to 3 hours, and a holmium laser is used to enucleate the prostate and push this tissue into the bladder.  A morcellator is then used to remove this tissue, which is sent for pathology.  The vast majority of patients are able to discharge the same day with a catheter in place for 2 to 3 days, and will follow-up in clinic for a voiding trial.  Approximately 5% of patients will be admitted overnight to monitor the urine, or if they have multiple co-morbidities.  We specifically discussed the risks of bleeding, infection, retrograde ejaculation, temporary urgency and urge incontinence, very low risk of long-term incontinence, pathologic evaluation of prostate tissue and possible detection of prostate cancer or other malignancy, and possible need for additional procedures.  He also has a long history of elevated PSA, most recently 8.6 with reassuring 22% free.  He has a history of a negative prostate biopsy.  We discussed the low, but not-zero, risk of identifying a clinically significant prostate cancer on HOLEP pathology by deferring repeat prostate biopsy.  We also discussed  the role of urodynamics regarding atonic bladder versus obstruction, and he would like to defer urodynamics as well.  He would like to proceed directly to [facility] which is reasonable.  Schedule HoLEP   Keith Madrid, MD 07/29/2019

## 2019-07-30 LAB — URINALYSIS, COMPLETE
Bilirubin, UA: NEGATIVE
Glucose, UA: NEGATIVE
Ketones, UA: NEGATIVE
Nitrite, UA: NEGATIVE
Protein,UA: NEGATIVE
Specific Gravity, UA: 1.025 (ref 1.005–1.030)
Urobilinogen, Ur: 0.2 mg/dL (ref 0.2–1.0)
pH, UA: 5.5 (ref 5.0–7.5)

## 2019-07-30 LAB — MICROSCOPIC EXAMINATION: Bacteria, UA: NONE SEEN

## 2019-08-02 DIAGNOSIS — H10502 Unspecified blepharoconjunctivitis, left eye: Secondary | ICD-10-CM | POA: Diagnosis not present

## 2019-08-05 DIAGNOSIS — B0233 Zoster keratitis: Secondary | ICD-10-CM | POA: Diagnosis not present

## 2019-08-05 MED FILL — valACYclovir HCL 1 GM TABS: 1 | 7 days supply | Qty: 21 | Fill #0

## 2019-08-05 MED FILL — ZIRGAN 0.15% OPHTHALMIC GEL: 0.15 | 10 days supply | Qty: 5 | Fill #0

## 2019-08-07 MED FILL — TAMSULOSIN HCL 0.4 MG CAP: 0.4 | 90 days supply | Qty: 90 | Fill #2

## 2019-08-11 DIAGNOSIS — B0233 Zoster keratitis: Secondary | ICD-10-CM | POA: Diagnosis not present

## 2019-08-14 ENCOUNTER — Other Ambulatory Visit: Payer: Self-pay | Admitting: Urology

## 2019-08-14 DIAGNOSIS — N138 Other obstructive and reflux uropathy: Secondary | ICD-10-CM

## 2019-08-14 DIAGNOSIS — N401 Enlarged prostate with lower urinary tract symptoms: Secondary | ICD-10-CM

## 2019-08-19 DIAGNOSIS — B0233 Zoster keratitis: Secondary | ICD-10-CM | POA: Diagnosis not present

## 2019-08-24 ENCOUNTER — Other Ambulatory Visit: Payer: Self-pay

## 2019-08-24 ENCOUNTER — Encounter
Admission: RE | Admit: 2019-08-24 | Discharge: 2019-08-24 | Disposition: A | Discharge status: Home / Self Care | Payer: 59 | Source: Ambulatory Visit | Attending: Urology | Admitting: Urology

## 2019-08-24 DIAGNOSIS — N401 Enlarged prostate with lower urinary tract symptoms: Secondary | ICD-10-CM

## 2019-08-24 HISTORY — DX: Benign prostatic hyperplasia without lower urinary tract symptoms: N40.0

## 2019-08-24 HISTORY — DX: Anxiety disorder, unspecified: F41.9

## 2019-08-24 HISTORY — DX: Unspecified asthma, uncomplicated: J45.909

## 2019-08-24 NOTE — Patient Instructions (Addendum)
COVID TESTING Date: Wednesday, June 23 Testing site:  [facility] [address] Thru Hours:  3:71 am - 1:00 pm Once you are tested, you are asked to stay quarantined (avoiding public places) until after your surgery.   Your procedure is scheduled on:  Friday, June 25  Report to Day Surgery on the 2nd floor of the [facility]. To find out your arrival time, please call [phone] between 1PM - 3PM on: Thursday, June 24  REMEMBER: Instructions that are not followed completely may result in serious medical risk, up to and including death; or upon the discretion of your surgeon and anesthesiologist your surgery may need to be rescheduled.  Do not eat food after midnight the night before surgery.  No gum chewing, lozengers or hard candies.  You may however, drink CLEAR liquids up to 2 hours before you are scheduled to arrive for your surgery. Do not drink anything within 2 hours of your scheduled arrival time.  Clear liquids include: - water  - apple juice without pulp - gatorade (not RED) - black coffee or tea (Do NOT add milk or creamers to the coffee or tea) Do NOT drink anything that is not on this list.  DO NOT TAKE ANY MEDICATIONS THE MORNING OF SURGERY  Stop Anti-inflammatories (NSAIDS) such as Advil, Aleve, Ibuprofen, Motrin, Naproxen, Naprosyn and Aspirin based products such as Excedrin, Goodys Powder, BC Powder. (May take Tylenol or Acetaminophen if needed.)  Stop ANY OVER THE COUNTER supplements until after surgery. (Stop taking Vitamin C and Vitamin E and Ginkgo Biloba) (May continue Vitamin B, and multivitamin.)  No Alcohol for 24 hours before or after surgery.  No Smoking including e-cigarettes for 24 hours prior to surgery.  No chewable tobacco products for at least 6 hours prior to surgery.  No nicotine patches on the day of surgery.  On the morning of surgery brush your teeth with toothpaste and water, you may rinse your mouth  with mouthwash if you wish. Do not swallow any toothpaste or mouthwash.  Do not wear jewelry.  Do not wear lotions, powders, or perfumes.   Do not shave 48 hours prior to surgery.   Do not bring valuables to the hospital. [facility] is not responsible for any missing/lost belongings or valuables.   Notify your doctor if there is any change in your medical condition (cold, fever, infection).  Wear comfortable clothing (specific to your surgery type) to the hospital.  After surgery, you can help prevent lung complications by doing breathing exercises.  Take deep breaths and cough every 1-2 hours. Your doctor may order a device called an Incentive Spirometer to help you take deep breaths.  If you are being discharged the day of surgery, you will not be allowed to drive home. You will need a responsible adult (18 years or older) to drive you home and stay with you that night.   If you are taking public transportation, you will need to have a responsible adult (18 years or older) with you. Please confirm with your physician that it is acceptable to use public transportation.   Please call the [facility] at [phone] if you have any questions about these instructions.  Visitation Policy:  Patients undergoing a surgery or procedure may have one family member or support person with them as long as that person is not COVID-19 positive or experiencing its symptoms.  That person may remain in the waiting area during the procedure.  As a reminder, masks are still required for all [facility] team members, patients and visitors in all [facility] facilities.   Systemwide, no visitors 17 or younger.

## 2019-08-25 ENCOUNTER — Other Ambulatory Visit: Payer: 59

## 2019-08-25 DIAGNOSIS — N401 Enlarged prostate with lower urinary tract symptoms: Secondary | ICD-10-CM

## 2019-08-28 LAB — CULTURE, URINE COMPREHENSIVE

## 2019-08-31 ENCOUNTER — Telehealth: Payer: Self-pay

## 2019-08-31 MED ORDER — SULFAMETHOXAZOLE-TRIMETHOPRIM 800-160 MG PO TABS: 1.0000 | ORAL_TABLET | Freq: Two times a day (BID) | ORAL | 0 refills | Status: DC

## 2019-08-31 MED FILL — FLUOROMETHOLONE 0.1% DROPS: 0.1 | 25 days supply | Qty: 5 | Fill #0

## 2019-08-31 MED FILL — SULFAMETHOXAZOLE-TMP DS TAB: 800-160 | 5 days supply | Qty: 10 | Fill #0

## 2019-08-31 NOTE — Telephone Encounter (Signed)
-----   Message from [name], MD sent at 08/28/2019  4:59 PM EDT ----- LETS DO Bactrim DS BID X 5 days to sterilize urine before surgery, thanks  [name], MD 08/28/2019

## 2019-08-31 NOTE — Telephone Encounter (Signed)
Left pt message to call, my chart sent, script sent to pharm

## 2019-09-02 ENCOUNTER — Other Ambulatory Visit
Admission: RE | Admit: 2019-09-02 | Discharge: 2019-09-02 | Disposition: A | Discharge status: Home / Self Care | Payer: 59 | Source: Ambulatory Visit | Attending: Urology | Admitting: Urology

## 2019-09-02 ENCOUNTER — Other Ambulatory Visit: Payer: Self-pay

## 2019-09-02 DIAGNOSIS — Z01812 Encounter for preprocedural laboratory examination: Secondary | ICD-10-CM | POA: Diagnosis present

## 2019-09-02 DIAGNOSIS — Z20822 Contact with and (suspected) exposure to covid-19: Secondary | ICD-10-CM | POA: Diagnosis not present

## 2019-09-02 LAB — SARS CORONAVIRUS 2 (TAT 6-24 HRS): SARS Coronavirus 2: NEGATIVE

## 2019-09-02 MED FILL — CITALOPRAM HBR 20 MG TABLET: 20 | 30 days supply | Qty: 30 | Fill #8

## 2019-09-04 ENCOUNTER — Ambulatory Visit: Payer: 59 | Admitting: Anesthesiology

## 2019-09-04 ENCOUNTER — Encounter
Admission: RE | Disposition: A | Discharge status: Home / Self Care | Payer: Self-pay | Source: Home / Self Care | Attending: Urology

## 2019-09-04 ENCOUNTER — Other Ambulatory Visit: Payer: Self-pay

## 2019-09-04 ENCOUNTER — Ambulatory Visit
Admission: RE | Admit: 2019-09-04 | Discharge: 2019-09-04 | Disposition: A | Discharge status: Home / Self Care | Payer: 59 | Attending: Urology | Admitting: Urology

## 2019-09-04 ENCOUNTER — Encounter: Payer: Self-pay | Admitting: Urology

## 2019-09-04 DIAGNOSIS — R310 Gross hematuria: Secondary | ICD-10-CM | POA: Insufficient documentation

## 2019-09-04 DIAGNOSIS — Y838 Other surgical procedures as the cause of abnormal reaction of the patient, or of later complication, without mention of misadventure at the time of the procedure: Secondary | ICD-10-CM | POA: Insufficient documentation

## 2019-09-04 DIAGNOSIS — C61 Malignant neoplasm of prostate: Secondary | ICD-10-CM | POA: Diagnosis not present

## 2019-09-04 DIAGNOSIS — R338 Other retention of urine: Secondary | ICD-10-CM | POA: Insufficient documentation

## 2019-09-04 DIAGNOSIS — N138 Other obstructive and reflux uropathy: Secondary | ICD-10-CM

## 2019-09-04 DIAGNOSIS — N401 Enlarged prostate with lower urinary tract symptoms: Secondary | ICD-10-CM | POA: Insufficient documentation

## 2019-09-04 DIAGNOSIS — J45909 Unspecified asthma, uncomplicated: Secondary | ICD-10-CM | POA: Diagnosis not present

## 2019-09-04 DIAGNOSIS — R3914 Feeling of incomplete bladder emptying: Secondary | ICD-10-CM | POA: Diagnosis not present

## 2019-09-04 DIAGNOSIS — N99820 Postprocedural hemorrhage and hematoma of a genitourinary system organ or structure following a genitourinary system procedure: Secondary | ICD-10-CM | POA: Diagnosis not present

## 2019-09-04 DIAGNOSIS — F17210 Nicotine dependence, cigarettes, uncomplicated: Secondary | ICD-10-CM | POA: Diagnosis not present

## 2019-09-04 DIAGNOSIS — N529 Male erectile dysfunction, unspecified: Secondary | ICD-10-CM | POA: Diagnosis not present

## 2019-09-04 HISTORY — PX: CYSTOSCOPY WITH FULGERATION: SHX6638

## 2019-09-04 HISTORY — PX: HOLEP-LASER ENUCLEATION OF THE PROSTATE WITH MORCELLATION: SHX6641

## 2019-09-04 SURGERY — CYSTOSCOPY, WITH BLADDER FULGURATION
Anesthesia: General

## 2019-09-04 SURGERY — ENUCLEATION, PROSTATE, USING LASER, WITH MORCELLATION
Anesthesia: General | Site: Prostate

## 2019-09-04 MED ORDER — OXYCODONE HCL 5 MG PO TABS
ORAL_TABLET | ORAL | Status: AC
  Filled 2019-09-04: qty 1

## 2019-09-04 MED ORDER — ACETAMINOPHEN 10 MG/ML IV SOLN
INTRAVENOUS | Status: AC
  Filled 2019-09-04: qty 100

## 2019-09-04 MED ORDER — EPHEDRINE SULFATE 50 MG/ML IJ SOLN
INTRAMUSCULAR | Status: DC | PRN
  Administered 2019-09-04: 10 mg via INTRAVENOUS

## 2019-09-04 MED ORDER — CEFAZOLIN SODIUM-DEXTROSE 2-3 GM-%(50ML) IV SOLR
INTRAVENOUS | Status: DC | PRN
  Administered 2019-09-04: 2 g via INTRAVENOUS

## 2019-09-04 MED ORDER — LIDOCAINE HCL (CARDIAC) PF 100 MG/5ML IV SOSY
PREFILLED_SYRINGE | INTRAVENOUS | Status: DC | PRN
  Administered 2019-09-04: 100 mg via INTRAVENOUS

## 2019-09-04 MED ORDER — FENTANYL CITRATE (PF) 100 MCG/2ML IJ SOLN: 25.0000 ug | INTRAMUSCULAR | Status: DC | PRN

## 2019-09-04 MED ORDER — BELLADONNA ALKALOIDS-OPIUM 16.2-60 MG RE SUPP
RECTAL | Status: AC
  Filled 2019-09-04: qty 1

## 2019-09-04 MED ORDER — LIDOCAINE HCL (CARDIAC) PF 100 MG/5ML IV SOSY
PREFILLED_SYRINGE | INTRAVENOUS | Status: DC | PRN
  Administered 2019-09-04: 50 mg via INTRAVENOUS

## 2019-09-04 MED ORDER — ORAL CARE MOUTH RINSE: 15.0000 mL | Freq: Once | OROMUCOSAL | Status: AC

## 2019-09-04 MED ORDER — EPHEDRINE SULFATE 50 MG/ML IJ SOLN
INTRAMUSCULAR | Status: DC | PRN
  Administered 2019-09-04: 10 mg via INTRAVENOUS
  Administered 2019-09-04: 20 mg via INTRAVENOUS
  Administered 2019-09-04: 10 mg via INTRAVENOUS

## 2019-09-04 MED ORDER — PROPOFOL 500 MG/50ML IV EMUL
INTRAVENOUS | Status: AC
  Filled 2019-09-04: qty 50

## 2019-09-04 MED ORDER — FAMOTIDINE 20 MG PO TABS: 20.0000 mg | ORAL_TABLET | Freq: Once | ORAL | Status: AC

## 2019-09-04 MED ORDER — SODIUM CHLORIDE 0.9 % IV SOLN
INTRAVENOUS | Status: DC | PRN
Start: 2019-09-04 — End: 2019-09-04

## 2019-09-04 MED ORDER — MIDAZOLAM HCL 2 MG/2ML IJ SOLN
INTRAMUSCULAR | Status: DC | PRN
  Administered 2019-09-04: 2 mg via INTRAVENOUS

## 2019-09-04 MED ORDER — CHLORHEXIDINE GLUCONATE 0.12 % MT SOLN: 15.0000 mL | Freq: Once | OROMUCOSAL | Status: AC

## 2019-09-04 MED ORDER — ONDANSETRON HCL 4 MG/2ML IJ SOLN
INTRAMUSCULAR | Status: DC | PRN
  Administered 2019-09-04: 4 mg via INTRAVENOUS

## 2019-09-04 MED ORDER — SUCCINYLCHOLINE CHLORIDE 20 MG/ML IJ SOLN
INTRAMUSCULAR | Status: DC | PRN
  Administered 2019-09-04: 100 mg via INTRAVENOUS

## 2019-09-04 MED ORDER — SUGAMMADEX SODIUM 200 MG/2ML IV SOLN
INTRAVENOUS | Status: DC | PRN
  Administered 2019-09-04: 149.6 mg via INTRAVENOUS

## 2019-09-04 MED ORDER — ACETAMINOPHEN 10 MG/ML IV SOLN
INTRAVENOUS | Status: DC | PRN
  Administered 2019-09-04: 1000 mg via INTRAVENOUS

## 2019-09-04 MED ORDER — FAMOTIDINE 20 MG PO TABS
ORAL_TABLET | ORAL | Status: AC
  Administered 2019-09-04: 20 mg via ORAL
  Filled 2019-09-04: qty 1

## 2019-09-04 MED ORDER — CEFAZOLIN SODIUM-DEXTROSE 2-4 GM/100ML-% IV SOLN
INTRAVENOUS | Status: AC
  Filled 2019-09-04: qty 100

## 2019-09-04 MED ORDER — SUGAMMADEX SODIUM 200 MG/2ML IV SOLN
INTRAVENOUS | Status: DC | PRN
  Administered 2019-09-04: 200 mg via INTRAVENOUS

## 2019-09-04 MED ORDER — FENTANYL CITRATE (PF) 100 MCG/2ML IJ SOLN
INTRAMUSCULAR | Status: DC | PRN
  Administered 2019-09-04 (×2): 50 ug via INTRAVENOUS

## 2019-09-04 MED ORDER — OXYCODONE HCL 5 MG PO TABS
5.0000 mg | ORAL_TABLET | Freq: Once | ORAL | Status: AC | PRN
  Administered 2019-09-04: 5 mg via ORAL

## 2019-09-04 MED ORDER — LACTATED RINGERS IV SOLN: INTRAVENOUS | Status: DC

## 2019-09-04 MED ORDER — CEFAZOLIN SODIUM-DEXTROSE 2-4 GM/100ML-% IV SOLN
2.0000 g | INTRAVENOUS | Status: AC
  Administered 2019-09-04: 2 g via INTRAVENOUS

## 2019-09-04 MED ORDER — EPHEDRINE 5 MG/ML INJ
INTRAVENOUS | Status: AC
  Filled 2019-09-04: qty 10

## 2019-09-04 MED ORDER — ONDANSETRON HCL 4 MG/2ML IJ SOLN: 4.0000 mg | Freq: Once | INTRAMUSCULAR | Status: DC | PRN

## 2019-09-04 MED ORDER — KETOROLAC TROMETHAMINE 30 MG/ML IJ SOLN
INTRAMUSCULAR | Status: DC | PRN
  Administered 2019-09-04: 30 mg via INTRAVENOUS

## 2019-09-04 MED ORDER — DEXAMETHASONE SODIUM PHOSPHATE 10 MG/ML IJ SOLN
INTRAMUSCULAR | Status: DC | PRN
  Administered 2019-09-04: 10 mg via INTRAVENOUS

## 2019-09-04 MED ORDER — PROPOFOL 10 MG/ML IV BOLUS
INTRAVENOUS | Status: DC | PRN
  Administered 2019-09-04: 150 mg via INTRAVENOUS

## 2019-09-04 MED ORDER — ROCURONIUM BROMIDE 100 MG/10ML IV SOLN
INTRAVENOUS | Status: DC | PRN
  Administered 2019-09-04: 25 mg via INTRAVENOUS

## 2019-09-04 MED ORDER — CHLORHEXIDINE GLUCONATE 0.12 % MT SOLN
OROMUCOSAL | Status: AC
  Administered 2019-09-04: 15 mL via OROMUCOSAL
  Filled 2019-09-04: qty 15

## 2019-09-04 MED ORDER — HYDROCODONE-ACETAMINOPHEN 5-325 MG PO TABS: 1.0000 | ORAL_TABLET | ORAL | 0 refills | Status: DC | PRN

## 2019-09-04 MED ORDER — FENTANYL CITRATE (PF) 100 MCG/2ML IJ SOLN
INTRAMUSCULAR | Status: AC
  Filled 2019-09-04: qty 2

## 2019-09-04 MED ORDER — MIDAZOLAM HCL 2 MG/2ML IJ SOLN
INTRAMUSCULAR | Status: AC
  Filled 2019-09-04: qty 2

## 2019-09-04 MED ORDER — SULFAMETHOXAZOLE-TRIMETHOPRIM 800-160 MG PO TABS
1.0000 | ORAL_TABLET | Freq: Every day | ORAL | 0 refills | Status: DC
Start: 2019-09-04 — End: 2019-10-28

## 2019-09-04 MED ORDER — OXYCODONE HCL 5 MG/5ML PO SOLN: 5.0000 mg | Freq: Once | ORAL | Status: AC | PRN

## 2019-09-04 MED ORDER — BELLADONNA ALKALOIDS-OPIUM 16.2-60 MG RE SUPP
RECTAL | Status: DC | PRN
  Administered 2019-09-04: 1 via RECTAL

## 2019-09-04 MED ORDER — FENTANYL CITRATE (PF) 100 MCG/2ML IJ SOLN
25.0000 ug | INTRAMUSCULAR | Status: DC | PRN
  Administered 2019-09-04: 25 ug via INTRAVENOUS

## 2019-09-04 MED ORDER — PHENYLEPHRINE HCL (PRESSORS) 10 MG/ML IV SOLN
INTRAVENOUS | Status: DC | PRN
  Administered 2019-09-04 (×5): 100 ug via INTRAVENOUS

## 2019-09-04 MED ORDER — ACETAMINOPHEN 10 MG/ML IV SOLN: 1000.0000 mg | Freq: Once | INTRAVENOUS | Status: DC | PRN

## 2019-09-04 MED ORDER — ROCURONIUM BROMIDE 100 MG/10ML IV SOLN
INTRAVENOUS | Status: DC | PRN
  Administered 2019-09-04: 40 mg via INTRAVENOUS
  Administered 2019-09-04: 30 mg via INTRAVENOUS
  Administered 2019-09-04: 10 mg via INTRAVENOUS
  Administered 2019-09-04: 30 mg via INTRAVENOUS
  Administered 2019-09-04: 20 mg via INTRAVENOUS

## 2019-09-04 MED ORDER — DEXAMETHASONE SODIUM PHOSPHATE 10 MG/ML IJ SOLN
INTRAMUSCULAR | Status: DC | PRN
  Administered 2019-09-04: 5 mg via INTRAVENOUS

## 2019-09-04 MED FILL — HYDROCODON-APAP 5-325: 5-325 | 4 days supply | Qty: 20 | Fill #0

## 2019-09-04 MED FILL — SULFAMETHOXAZOLE-TMP DS TAB: 800-160 | 5 days supply | Qty: 5 | Fill #0

## 2019-09-04 SURGICAL SUPPLY — 34 items
ADAPTER IRRIG TUBE 2 SPIKE SOL (ADAPTER) ×4 IMPLANT
BAG URO DRAIN 4000ML (MISCELLANEOUS) ×2 IMPLANT
CATH FOLEY 3WAY 30CC 24FR (CATHETERS) ×2
CATH URETL 5X70 OPEN END (CATHETERS) ×2 IMPLANT
CATH URTH STD 24FR FL 3W 2 (CATHETERS) ×1 IMPLANT
CONTAINER COLLECT MORCELLATR (MISCELLANEOUS) ×1 IMPLANT
DRAPE UTILITY 15X26 TOWEL STRL (DRAPES) ×2 IMPLANT
ELECT BIVAP BIPO 22/24 DONUT (ELECTROSURGICAL)
ELECTRD BIVAP BIPO 22/24 DONUT (ELECTROSURGICAL) IMPLANT
FILTER OVERFLOW MORCELLATOR (FILTER) ×1 IMPLANT
GLOVE BIOGEL PI IND STRL 7.5 (GLOVE) ×1 IMPLANT
GLOVE BIOGEL PI INDICATOR 7.5 (GLOVE) ×1
GOWN STRL REUS W/ TWL LRG LVL3 (GOWN DISPOSABLE) ×1 IMPLANT
GOWN STRL REUS W/ TWL XL LVL3 (GOWN DISPOSABLE) ×1 IMPLANT
GOWN STRL REUS W/TWL LRG LVL3 (GOWN DISPOSABLE) ×2
GOWN STRL REUS W/TWL XL LVL3 (GOWN DISPOSABLE) ×2
HOLDER FOLEY CATH W/STRAP (MISCELLANEOUS) ×2 IMPLANT
KIT TURNOVER CYSTO (KITS) ×2 IMPLANT
LASER FIBER 550M SMARTSCOPE (Laser) ×2 IMPLANT
MBRN O SEALING YLW 17 FOR INST (MISCELLANEOUS) ×2
MEMBRANE SLNG YLW 17 FOR INST (MISCELLANEOUS) ×1 IMPLANT
MORCELLATOR COLLECT CONTAINER (MISCELLANEOUS) ×2
MORCELLATOR OVERFLOW FILTER (FILTER) ×2
MORCELLATOR ROTATION 4.75 335 (MISCELLANEOUS) ×2 IMPLANT
PACK CYSTO AR (MISCELLANEOUS) ×2 IMPLANT
SET CYSTO W/LG BORE CLAMP LF (SET/KITS/TRAYS/PACK) ×2 IMPLANT
SET IRRIG Y TYPE TUR BLADDER L (SET/KITS/TRAYS/PACK) ×2 IMPLANT
SLEEVE PROTECTION STRL DISP (MISCELLANEOUS) ×4 IMPLANT
SOL .9 NS 3000ML IRR  AL (IV SOLUTION) ×24
SOL .9 NS 3000ML IRR UROMATIC (IV SOLUTION) ×12 IMPLANT
SURGILUBE 2OZ TUBE FLIPTOP (MISCELLANEOUS) ×2 IMPLANT
SYRINGE IRR TOOMEY STRL 70CC (SYRINGE) ×2 IMPLANT
TUBE PUMP MORCELLATOR PIRANHA (TUBING) ×2 IMPLANT
WATER STERILE IRR 1000ML POUR (IV SOLUTION) ×2 IMPLANT

## 2019-09-04 SURGICAL SUPPLY — 24 items
BAG DRAIN CYSTO-URO LG1000N (MISCELLANEOUS) ×4 IMPLANT
BRUSH SCRUB EZ 1% IODOPHOR (MISCELLANEOUS) ×2 IMPLANT
CATH URETL 5X70 OPEN END (CATHETERS) IMPLANT
ELECT BIVAP BIPO 22/24 DONUT (ELECTROSURGICAL) ×2
ELECT LOOP 22F BIPOLAR SML (ELECTROSURGICAL) ×2
ELECT REM PT RETURN 9FT ADLT (ELECTROSURGICAL) ×2
ELECTRD BIVAP BIPO 22/24 DONUT (ELECTROSURGICAL) ×1 IMPLANT
ELECTRODE LOOP 22F BIPOLAR SML (ELECTROSURGICAL) ×1 IMPLANT
ELECTRODE REM PT RTRN 9FT ADLT (ELECTROSURGICAL) ×1 IMPLANT
GLOVE BIOGEL PI IND STRL 7.5 (GLOVE) ×2 IMPLANT
GLOVE BIOGEL PI INDICATOR 7.5 (GLOVE) ×2
GOWN STRL REUS W/ TWL LRG LVL3 (GOWN DISPOSABLE) ×2 IMPLANT
GOWN STRL REUS W/ TWL XL LVL3 (GOWN DISPOSABLE) ×2 IMPLANT
GOWN STRL REUS W/TWL LRG LVL3 (GOWN DISPOSABLE) ×4
GOWN STRL REUS W/TWL XL LVL3 (GOWN DISPOSABLE) ×4
GUIDEWIRE STR DUAL SENSOR (WIRE) ×2 IMPLANT
PACK CYSTO AR (MISCELLANEOUS) ×4 IMPLANT
SET CYSTO W/LG BORE CLAMP LF (SET/KITS/TRAYS/PACK) ×4 IMPLANT
SET IRRIGATING DISP (SET/KITS/TRAYS/PACK) ×2 IMPLANT
SOL .9 NS 3000ML IRR  AL (IV SOLUTION) ×4
SOL .9 NS 3000ML IRR UROMATIC (IV SOLUTION) ×2 IMPLANT
SURGILUBE 2OZ TUBE FLIPTOP (MISCELLANEOUS) ×2 IMPLANT
WATER STERILE IRR 1000ML POUR (IV SOLUTION) ×4 IMPLANT
WATER STERILE IRR 3000ML UROMA (IV SOLUTION) ×2 IMPLANT

## 2019-09-04 NOTE — Progress Notes (Signed)
Urine was clear post op, become abruptly dark red in PACU prior to discharge.  Irrigated with 1L NS, remains dark red, remains dark red on fast CBI.  Informed consent obtained for return to OR for fulguration of bleeder.  [name], MD 09/04/2019

## 2019-09-04 NOTE — Op Note (Signed)
Date of procedure: 09/04/19  Preoperative diagnosis:  1. BPH with incomplete bladder emptying  Postoperative diagnosis:  1. Same  Procedure: 1. HoLEP (Holmium Laser Enucleation of the Prostate)  Surgeon: [name], MD  Anesthesia: General  Complications: None  Intraoperative findings:  1.  Massive prostate with obstructing lateral lobes and high bladder neck 2.  Ureteral orifices and verumontanum intact at conclusion of case  EBL: 25 mL  Specimens: Prostate chips  Enucleation time: 71 minutes  Morcellation time: 38 minutes  Intra-op weight: 109g  Drains: 24 French three-way, 80 cc in balloon  Indication: Kamoni Gentles is a 64 y.o. patient with BPH and 115 g prostate with bothersome urinary symptoms and  retention requiring intermittent catheterization multiple times per week.  After reviewing the management options for treatment, they elected to proceed with the above surgical procedure(s). We have discussed the potential benefits and risks of the procedure, side effects of the proposed treatment, the likelihood of the patient achieving the goals of the procedure, and any potential problems that might occur during the procedure or recuperation.  We specifically discussed the risks of bleeding, infection, hematuria and clot retention, need for additional procedures, possible overnight hospital stay, temporary urgency and incontinence, rare long-term incontinence, and retrograde ejaculation.  Informed consent has been obtained.   Description of procedure:  The patient was taken to the operating room and general anesthesia was induced.  The patient was placed in the dorsal lithotomy position, prepped and draped in the usual sterile fashion, and preoperative antibiotics(Ancef) were administered.  SCDs were placed for DVT prophylaxis.  A preoperative time-out was performed.   The 63 French continuous flow resectoscope was inserted into the urethra using the visual obturator   The prostate was very large with obstructing lateral lobes and a high bladder neck. The bladder was thoroughly inspected and notable for moderate bladder trabeculations.  The ureteral orifices were located in orthotopic position.  The laser was set to 2 J and 50 Hz and was used to make an incision at the 6 o'clock position to the level of the capsule from the bladder neck to the verumontanum.  The lateral lobes were then incised circumferentially until they were disconnected from the surrounding tissue.  There were some BPH nodules on the left side that made enucleation slightly more challenging, as well as some large vessels that were controlled with the laser.  The capsule was examined and laser was used for meticulous hemostasis.    The 6 French resectoscope was then switched out for the 61 French nephroscope and the lobes were morcellated and the tissue sent to pathology.  A 24 French three-way catheter was inserted easily, and CBI was initiated.  80 cc were placed in the balloon.  Urine was light pink.  The catheter irrigated easily with a Toomey syringe.  A belladonna suppository was placed.  The catheter was secured on gentle traction.  The patient tolerated the procedure well without any immediate complications and was extubated and transferred to the recovery room in stable condition.  Urine was watermelon on fast CBI.  Disposition: Stable to PACU  Plan: Wean CBI in PACU, anticipate discharge home today with void trial in clinic in 2-3 days  [name], MD 09/04/2019

## 2019-09-04 NOTE — Progress Notes (Signed)
I was called to evaluate Mr. Matters in the PACU.  He underwent HoLEP today and had to be taken back for some bleeding at the bladder neck and prostatic fossa.  I read Dr. Lars Masson operative notes and discussed the patient with Dr. Diamantina Providence. In the PACU the patient has been off CBI for about an hour and the urine is light red.  There have been no clots.  He is draining well.  He would really like to go home.  I turned the CBI on slowly and within a few drips it cleared to light pink and then clear.  To be certain I took the catheter off traction and then I hand irrigated the catheter and it quickly cleared.  There was no significant ongoing bleeding and no clots.  Discussed again with the patient about going home versus staying and he would really like to go home.  He knows to return to the emergency room if he does not feel well or the catheter stops draining or he has painful inability to void.  His primary urologist is Dr. Diona Fanti who referred to Dr. Glori Luis HoLEP, so the pt is established with [facility].

## 2019-09-04 NOTE — Transfer of Care (Signed)
Immediate Anesthesia Transfer of Care Note  Patient: Keith Beard  Procedure(s) Performed: HOLEP-LASER ENUCLEATION OF THE PROSTATE WITH MORCELLATION (N/A Prostate)  Patient Location: PACU  Anesthesia Type:General  Level of Consciousness: awake, alert  and oriented  Airway & Oxygen Therapy: Patient Spontanous Breathing and Patient connected to face mask oxygen  Post-op Assessment: Report given to RN and Post -op Vital signs reviewed and stable  Post vital signs: Reviewed and stable  Last Vitals:  Vitals Value Taken Time  BP 159/60 09/04/19 1006  Temp    Pulse 75 09/04/19 1006  Resp 21 09/04/19 1006  SpO2 100 % 09/04/19 1006  Vitals shown include unvalidated device data.  Last Pain:  Vitals:   09/04/19 0617  TempSrc: Oral  PainSc: 0-No pain         Complications: No complications documented.

## 2019-09-04 NOTE — Anesthesia Procedure Notes (Signed)
Procedure Name: Intubation Date/Time: 09/04/2019 7:45 AM Performed by: Nelda Marseille, CRNA Pre-anesthesia Checklist: Patient identified, Patient being monitored, Timeout performed, Emergency Drugs available and Suction available Patient Re-evaluated:Patient Re-evaluated prior to induction Oxygen Delivery Method: Circle system utilized Preoxygenation: Pre-oxygenation with 100% oxygen Induction Type: IV induction Ventilation: Mask ventilation without difficulty Laryngoscope Size: Mac, 3 and McGraph Grade View: Grade I Tube type: Oral Tube size: 7.0 mm Number of attempts: 1 Airway Equipment and Method: Stylet Placement Confirmation: ETT inserted through vocal cords under direct vision,  positive ETCO2 and breath sounds checked- equal and bilateral Secured at: 21 cm Tube secured with: Tape Dental Injury: Teeth and Oropharynx as per pre-operative assessment

## 2019-09-04 NOTE — OR Nursing (Signed)
Ketchup looking color and consistency noted in foley tubing; notified MD in OR.  Irrigated foley as instructed; MD will see pt in postop when current case is completed.

## 2019-09-04 NOTE — Progress Notes (Signed)
CBI clamped off.

## 2019-09-04 NOTE — H&P (Signed)
09/04/19 7:06 AM   Keith Beard November 05, 1955 [id number]   HPI: He is a healthy 64 year old male with a long history of elevated PSA and BPH symptoms.  He underwent a prostate biopsy in 2017 for PSA of 4 which showed a 111 g prostate but no evidence of malignancy.  He has been on Flomax and Cialis long-term.  He also has erectile dysfunction that is refractory to both penile injections and Cialis.  He also has some ventral curvature of the penis with erections.  He was offered finasteride by Dr. Diona Fanti, but he deferred secondary to side effect profile.  He has ongoing bothersome urinary symptoms of weak stream and feeling of incomplete emptying, urgency, frequency, and perform CIC 4 times per week when he is unable to urinate. Cystoscopy showed enlarged prostate with no strictures. He has elected HoLEP for outlet procedure.  Has been on culture appropriate Bactrim for 10-25K staph epidermidis on urine culture 6/15, suspect colonization from CIC.  Last PSA was 8.6 with 22% free, history of negative biopsy.  PMH: Past Medical History:  Diagnosis Date  . Anxiety   . Asthma    childhood  . BPH (benign prostatic hyperplasia)     Surgical History: Past Surgical History:  Procedure Laterality Date  . COLONOSCOPY    . TONSILLECTOMY    . WISDOM TOOTH EXTRACTION     20 years ago   Family History: Family History  Problem Relation Age of Onset  . Hypertension Father   . Colon cancer Neg Hx   . Colon polyps Neg Hx   . Esophageal cancer Neg Hx   . Stomach cancer Neg Hx   . Rectal cancer Neg Hx   . Prostate cancer Neg Hx   . Kidney cancer Neg Hx   . Bladder Cancer Neg Hx     Social History:  reports that he has been smoking cigarettes. He has been smoking about 1.00 pack per day. He has never used smokeless tobacco. He reports current alcohol use of about 4.0 - 5.0 standard drinks of alcohol per week. He reports that he does not use drugs.  Physical Exam: BP (!) 130/92   Pulse  62   Temp (!) 97.3 F (36.3 C) (Oral)   Resp 16   Ht 5\' 9"  (1.753 m)   Wt 74.8 kg   SpO2 98%   BMI 24.37 kg/m    Constitutional:  Alert and oriented, No acute distress. Cardiovascular: RRR Respiratory: CTA bilaterally GI: Abdomen is soft, nontender, nondistended, no abdominal masses  Assessment & Plan:   We discussed the risks and benefits of HoLEP at length.  The procedure requires general anesthesia and takes 2 to 3 hours, and a holmium laser is used to enucleate the prostate and push this tissue into the bladder.  A morcellator is then used to remove this tissue, which is sent for pathology.  The vast majority of patients are able to discharge the same day with a catheter in place for 2 to 3 days, and will follow-up in clinic for a voiding trial.  Approximately 5% of patients will be admitted overnight to monitor the urine, or if they have multiple co-morbidities.  We specifically discussed the risks of bleeding, infection, retrograde ejaculation, temporary urgency and urge incontinence, very low risk of long-term incontinence, pathologic evaluation of prostate tissue and possible detection of prostate cancer or other malignancy, and possible need for additional procedures.  HoLEP  [name], MD 09/04/2019  [facility]  [address] [place], [place] [address] [phone]

## 2019-09-04 NOTE — Transfer of Care (Signed)
Immediate Anesthesia Transfer of Care Note  Patient: Keith Beard  Procedure(s) Performed: [place] (N/A )  Patient Location: PACU  Anesthesia Type:General  Level of Consciousness: awake and alert   Airway & Oxygen Therapy: Patient Spontanous Breathing and Patient connected to face mask oxygen  Post-op Assessment: Report given to RN and Post -op Vital signs reviewed and stable  Post vital signs: Reviewed  Last Vitals:  Vitals Value Taken Time  BP    Temp    Pulse 103 09/04/19 1545  Resp 20 09/04/19 1545  SpO2 98 % 09/04/19 1545  Vitals shown include unvalidated device data.  Last Pain:  Vitals:   09/04/19 1242  TempSrc:   PainSc: 0-No pain         Complications: No complications documented.

## 2019-09-04 NOTE — OR Nursing (Signed)
Dr. Diamantina Providence in to see pt approx 1245; irrigation multiple times  CBI started by MD @ 1259 pm.  States he will be back to see pt shortly.

## 2019-09-04 NOTE — Anesthesia Preprocedure Evaluation (Signed)
Anesthesia Evaluation  Patient identified by MRN, date of birth, ID band Patient awake    Reviewed: Allergy & Precautions, NPO status , Patient's Chart, lab work & pertinent test results  History of Anesthesia Complications Negative for: history of anesthetic complications  Airway Mallampati: II  TM Distance: >3 FB Neck ROM: Full    Dental no notable dental hx. (+) Teeth Intact, Dental Advisory Given   Pulmonary neg sleep apnea, neg COPD, Current Smoker and Patient abstained from smoking.,    Pulmonary exam normal breath sounds clear to auscultation       Cardiovascular Exercise Tolerance: Good METS(-) hypertension(-) CAD and (-) Past MI negative cardio ROS  (-) dysrhythmias  Rhythm:Regular Rate:Normal - Systolic murmurs    Neuro/Psych PSYCHIATRIC DISORDERS Anxiety negative neurological ROS     GI/Hepatic neg GERD  ,(+)     (-) substance abuse  ,   Endo/Other  neg diabetes  Renal/GU negative Renal ROS     Musculoskeletal   Abdominal   Peds  Hematology   Anesthesia Other Findings Past Medical History: No date: Anxiety No date: Asthma     Comment:  childhood No date: BPH (benign prostatic hyperplasia)  Reproductive/Obstetrics                             Anesthesia Physical  Anesthesia Plan  ASA: II  Anesthesia Plan: General   Post-op Pain Management:    Induction: Intravenous  PONV Risk Score and Plan: 2 and Ondansetron, Dexamethasone and Midazolam  Airway Management Planned: Oral ETT  Additional Equipment: None  Intra-op Plan:   Post-operative Plan: Extubation in OR  Informed Consent: I have reviewed the patients History and Physical, chart, labs and discussed the procedure including the risks, benefits and alternatives for the proposed anesthesia with the patient or authorized representative who has indicated his/her understanding and acceptance.     Dental  advisory given  Plan Discussed with: CRNA and Surgeon  Anesthesia Plan Comments: (Take back to OR due to bleeding)        Anesthesia Quick Evaluation

## 2019-09-04 NOTE — Anesthesia Postprocedure Evaluation (Signed)
Anesthesia Post Note  Patient: Keith Beard  Procedure(s) Performed: HOLEP-LASER ENUCLEATION OF THE PROSTATE WITH MORCELLATION (N/A Prostate)  Patient location during evaluation: PACU Anesthesia Type: General Level of consciousness: awake and alert Pain management: pain level controlled Vital Signs Assessment: post-procedure vital signs reviewed and stable Respiratory status: spontaneous breathing, nonlabored ventilation, respiratory function stable and patient connected to nasal cannula oxygen Cardiovascular status: blood pressure returned to baseline and stable Postop Assessment: no apparent nausea or vomiting Anesthetic complications: no   No complications documented.   Last Vitals:  Vitals:   09/04/19 1120 09/04/19 1131  BP: 126/75 136/78  Pulse: (!) 56 62  Resp: 11 16  Temp: (!) 36.2 C (!) 35.8 C  SpO2: 94% 95%    Last Pain:  Vitals:   09/04/19 1131  TempSrc: Temporal  PainSc: 0-No pain                 Arita Miss

## 2019-09-04 NOTE — OR Nursing (Signed)
To OR via stretcher by CRNA @ 1427.

## 2019-09-04 NOTE — Progress Notes (Signed)
This RN started weaning CBI at 17. Fluid draining from foley is clear to light pink at this time.

## 2019-09-04 NOTE — Op Note (Signed)
Date of procedure: 09/04/19  Preoperative diagnosis:  1. Gross hematuria  Postoperative diagnosis:  1. Same  Procedure: 1. Cystoscopy, clot evacuation, fulguration  Surgeon: [name], MD  Anesthesia: General  Complications: None  Intraoperative findings:  1.  Small arterial bleeder at 7 o'clock position of bladder neck, fulgurated with bipolar button.  30 cc old clot evacuated 2.  Small residual prostate fragments evacuated 3.  Excellent hemostasis at conclusion, urine clear  EBL: 30 cc  Specimens: None  Drains: 24 French three-way Foley  Indication: Keith Beard is a 64 y.o. patient that underwent HoLEP earlier this morning and in PACU developed bright red bleeding per catheter which did not clear quickly with hand irrigation or CBI.  We discussed the risks, benefits, and alternatives, and he opted to return to the OR for fulguration of any bleeding vessels.  After reviewing the management options for treatment, they elected to proceed with the above surgical procedure(s). We have discussed the potential benefits and risks of the procedure, side effects of the proposed treatment, the likelihood of the patient achieving the goals of the procedure, and any potential problems that might occur during the procedure or recuperation. Informed consent has been obtained.  Description of procedure:  The patient was taken to the operating room and general anesthesia was induced. SCDs were placed for DVT prophylaxis.. The patient was placed in the dorsal lithotomy position, prepped and draped in the usual sterile fashion, and preoperative antibiotics(Ancef) were administered. A preoperative time-out was performed.   The 25 French resectoscope with the visual obturator was used to enter the bladder.  30 cc of old clot were evacuated free.  There was an arterial bleeder noted immediately at 7 o'clock position of the bladder neck, and this was fulgurated with the bipolar button.   Thorough inspection of the prostatic fossa was performed, and any bleeding areas were thoroughly fulgurated.  There was excellent hemostasis.  There were a few small residual prostate fragments in the bladder which were irrigated free.  The ureteral orifices were intact at the conclusion of the case.  With the bladder decompressed there was excellent hemostasis.  I irrigated through the sheath and urine was clear.  A 24 French three-way passed easily into the bladder and 60 cc were placed in the balloon.  Urine was clear.  The catheter irrigated easily with clear urine.  He was started on CBI and urine was crystal clear on a fast drip.  Disposition: Stable to PACU  Plan: Wean CBI in PACU, and still anticipate discharge home today Follow-up as scheduled for Foley removal in clinic  [name], MD

## 2019-09-04 NOTE — Anesthesia Preprocedure Evaluation (Signed)
Anesthesia Evaluation  Patient identified by MRN, date of birth, ID band Patient awake    Reviewed: Allergy & Precautions, NPO status , Patient's Chart, lab work & pertinent test results  History of Anesthesia Complications Negative for: history of anesthetic complications  Airway Mallampati: II  TM Distance: >3 FB Neck ROM: Full    Dental no notable dental hx. (+) Teeth Intact, Dental Advisory Given   Pulmonary neg sleep apnea, neg COPD, Current SmokerPatient did not abstain from smoking.,    Pulmonary exam normal breath sounds clear to auscultation       Cardiovascular Exercise Tolerance: Good METS(-) hypertension(-) CAD and (-) Past MI negative cardio ROS  (-) dysrhythmias  Rhythm:Regular Rate:Normal - Systolic murmurs    Neuro/Psych PSYCHIATRIC DISORDERS Anxiety negative neurological ROS     GI/Hepatic neg GERD  ,(+)     (-) substance abuse  ,   Endo/Other  neg diabetes  Renal/GU negative Renal ROS     Musculoskeletal   Abdominal   Peds  Hematology   Anesthesia Other Findings Past Medical History: No date: Anxiety No date: Asthma     Comment:  childhood No date: BPH (benign prostatic hyperplasia)  Reproductive/Obstetrics                             Anesthesia Physical Anesthesia Plan  ASA: II  Anesthesia Plan: General   Post-op Pain Management:    Induction: Intravenous  PONV Risk Score and Plan: 2 and Ondansetron, Dexamethasone and Midazolam  Airway Management Planned: Oral ETT  Additional Equipment: None  Intra-op Plan:   Post-operative Plan: Extubation in OR  Informed Consent: I have reviewed the patients History and Physical, chart, labs and discussed the procedure including the risks, benefits and alternatives for the proposed anesthesia with the patient or authorized representative who has indicated his/her understanding and acceptance.     Dental  advisory given  Plan Discussed with: CRNA and Surgeon  Anesthesia Plan Comments: (Discussed risks of anesthesia with patient, including PONV, sore throat, lip/dental damage. Rare risks discussed as well, such as cardiorespiratory and neurological sequelae. Patient understands.)        Anesthesia Quick Evaluation

## 2019-09-04 NOTE — Progress Notes (Signed)
Urine has become darker in color. Now it has a darker red appearance. Dr. Diamantina Providence notified. Acknowledged. Dr. Junious Silk to come by and assess pt before pt is discharged.

## 2019-09-04 NOTE — Anesthesia Procedure Notes (Signed)
Procedure Name: Intubation Performed by: Isrrael Fluckiger, CRNA Pre-anesthesia Checklist: Patient identified, Patient being monitored, Timeout performed, Emergency Drugs available and Suction available Patient Re-evaluated:Patient Re-evaluated prior to induction Oxygen Delivery Method: Circle system utilized Preoxygenation: Pre-oxygenation with 100% oxygen Induction Type: IV induction and Rapid sequence Laryngoscope Size: 3 and McGraph Grade View: Grade I Tube type: Oral Tube size: 7.5 mm Number of attempts: 1 Airway Equipment and Method: Stylet and Video-laryngoscopy Placement Confirmation: ETT inserted through vocal cords under direct vision,  positive ETCO2 and breath sounds checked- equal and bilateral Secured at: 22 cm Tube secured with: Tape Dental Injury: Teeth and Oropharynx as per pre-operative assessment        

## 2019-09-04 NOTE — OR Nursing (Signed)
Will return to surgery per Dr. Glori Luis.  Anesthesia in to see pt in postop 13:16.

## 2019-09-04 NOTE — Discharge Instructions (Addendum)
Indwelling Urinary Catheter Care, Adult An indwelling urinary catheter is a thin tube that is put into your bladder. The tube helps to drain pee (urine) out of your body. The tube goes in through your urethra. Your urethra is where pee comes out of your body. Your pee will come out through the catheter, then it will go into a bag (drainage bag). Take good care of your catheter so it will work well. How to wear your catheter and bag Supplies needed  Sticky tape (adhesive tape) or a leg strap.  Alcohol wipe or soap and water (if you use tape).  A clean towel (if you use tape).  Large overnight bag.  Smaller bag (leg bag). Wearing your catheter Attach your catheter to your leg with tape or a leg strap.  Make sure the catheter is not pulled tight.  If a leg strap gets wet, take it off and put on a dry strap.  If you use tape to hold the bag on your leg: 1. Use an alcohol wipe or soap and water to wash your skin where the tape made it sticky before. 2. Use a clean towel to pat-dry that skin. 3. Use new tape to make the bag stay on your leg. Wearing your bags You should have been given a large overnight bag.  You may wear the overnight bag in the day or night.  Always have the overnight bag lower than your bladder.  Do not let the bag touch the floor.  Before you go to sleep, put a clean plastic bag in a wastebasket. Then hang the overnight bag inside the wastebasket. You should also have a smaller leg bag that fits under your clothes.  Always wear the leg bag below your knee.  Do not wear your leg bag at night. How to care for your skin and catheter Supplies needed  A clean washcloth.  Water and mild soap.  A clean towel. Caring for your skin and catheter      Clean the skin around your catheter every day: 1. Wash your hands with soap and water. 2. Wet a clean washcloth in warm water and mild soap. 3. Clean the skin around your urethra.  If you are  male:  Gently spread the folds of skin around your vagina (labia).  With the washcloth in your other hand, wipe the inner side of your labia on each side. Wipe from front to back.  If you are male:  Pull back any skin that covers the end of your penis (foreskin).  With the washcloth in your other hand, wipe your penis in small circles. Start wiping at the tip of your penis, then move away from the catheter.  Move the foreskin back in place, if needed. 4. With your free hand, hold the catheter close to where it goes into your body.  Keep holding the catheter during cleaning so it does not get pulled out. 5. With the washcloth in your other hand, clean the catheter.  Only wipe downward on the catheter.  Do not wipe upward toward your body. Doing this may push germs into your urethra and cause infection. 6. Use a clean towel to pat-dry the catheter and the skin around it. Make sure to wipe off all soap. 7. Wash your hands with soap and water.  Shower every day. Do not take baths.  Do not use cream, ointment, or lotion on the area where the catheter goes into your body, unless your doctor tells you   to.  Do not use powders, sprays, or lotions on your genital area.  Check your skin around the catheter every day for signs of infection. Check for: ? Redness, swelling, or pain. ? Fluid or blood. ? Warmth. ? Pus or a bad smell. How to empty the bag Supplies needed  Rubbing alcohol.  Gauze pad or cotton ball.  Tape or a leg strap. Emptying the bag Pour the pee out of your bag when it is ?- full, or at least 2-3 times a day. Do this for your overnight bag and your leg bag. 1. Wash your hands with soap and water. 2. Separate (detach) the bag from your leg. 3. Hold the bag over the toilet or a clean pail. Keep the bag lower than your hips and bladder. This is so the pee (urine) does not go back into the tube. 4. Open the pour spout. It is at the bottom of the bag. 5. Empty the  pee into the toilet or pail. Do not let the pour spout touch any surface. 6. Put rubbing alcohol on a gauze pad or cotton ball. 7. Use the gauze pad or cotton ball to clean the pour spout. 8. Close the pour spout. 9. Attach the bag to your leg with tape or a leg strap. 10. Wash your hands with soap and water. Follow instructions for cleaning the drainage bag:  From the product maker.  As told by your doctor. How to change the bag Supplies needed  Alcohol wipes.  A clean bag.  Tape or a leg strap. Changing the bag Replace your bag when it starts to leak, smell bad, or look dirty. 1. Wash your hands with soap and water. 2. Separate the dirty bag from your leg. 3. Pinch the catheter with your fingers so that pee does not spill out. 4. Separate the catheter tube from the bag tube where these tubes connect (at the connection valve). Do not let the tubes touch any surface. 5. Clean the end of the catheter tube with an alcohol wipe. Use a different alcohol wipe to clean the end of the bag tube. 6. Connect the catheter tube to the tube of the clean bag. 7. Attach the clean bag to your leg with tape or a leg strap. Do not make the bag tight on your leg. 8. Wash your hands with soap and water. General rules   Never pull on your catheter. Never try to take it out. Doing that can hurt you.  Always wash your hands before and after you touch your catheter or bag. Use a mild, fragrance-free soap. If you do not have soap and water, use hand sanitizer.  Always make sure there are no twists or bends (kinks) in the catheter tube.  Always make sure there are no leaks in the catheter or bag.  Drink enough fluid to keep your pee pale yellow.  Do not take baths, swim, or use a hot tub.  If you are male, wipe from front to back after you poop (have a bowel movement). Contact a doctor if:  Your pee is cloudy.  Your pee smells worse than usual.  Your catheter gets clogged.  Your catheter  leaks.  Your bladder feels full. Get help right away if:  You have redness, swelling, or pain where the catheter goes into your body.  You have fluid, blood, pus, or a bad smell coming from the area where the catheter goes into your body.  Your skin feels warm where   where the catheter goes into your body.  You have a fever.  You have pain in your: ? Belly (abdomen). ? Legs. ? Lower back. ? Bladder.  You see blood in the catheter.  Your pee is pink or red.  You feel sick to your stomach (nauseous).  You throw up (vomit).  You have chills.  Your pee is not draining into the bag.  Your catheter gets pulled out. Summary  An indwelling urinary catheter is a thin tube that is placed into the bladder to help drain pee (urine) out of the body.  The catheter is placed into the part of the body that drains pee from the bladder (urethra).  Taking good care of your catheter will keep it working properly and help prevent problems.  Always wash your hands before and after touching your catheter or bag.  Never pull on your catheter or try to take it out. This information is not intended to replace advice given to you by your health care provider. Make sure you discuss any questions you have with your health care provider. Document Revised: 06/20/2018 Document Reviewed: 10/12/2016 Elsevier Patient Education  2020 [employer]     AMBULATORY SURGERY  DISCHARGE INSTRUCTIONS   1) The drugs that you were given will stay in your system until tomorrow so for the next 24 hours you should not:  A) Drive an automobile B) Make any legal decisions C) Drink any alcoholic beverage   2) You may resume regular meals tomorrow.  Today it is better to start with liquids and gradually work up to solid foods.  You may eat anything you prefer, but it is better to start with liquids, then soup and crackers, and gradually work up to solid foods.   3) Please notify your doctor immediately if  you have any unusual bleeding, trouble breathing, redness and pain at the surgery site, drainage, fever, or pain not relieved by medication.    4) Additional Instructions:        Please contact your physician with any problems or Same Day Surgery at [phone], Monday through Friday 6 am to 4 pm, or [facility] Main number at [phone]. 

## 2019-09-04 NOTE — Anesthesia Postprocedure Evaluation (Signed)
Anesthesia Post Note  Patient: Keith Beard  Procedure(s) Performed: [place] (N/A )  Patient location during evaluation: PACU Anesthesia Type: General Level of consciousness: awake and alert Pain management: pain level controlled Vital Signs Assessment: post-procedure vital signs reviewed and stable Respiratory status: spontaneous breathing, nonlabored ventilation, respiratory function stable and patient connected to nasal cannula oxygen Cardiovascular status: blood pressure returned to baseline and stable Postop Assessment: no apparent nausea or vomiting Anesthetic complications: no   No complications documented.   Last Vitals:  Vitals:   09/04/19 1548 09/04/19 1603  BP: (!) 120/57 115/79  Pulse: 95 81  Resp: 17 15  Temp: (!) 36 C   SpO2: 92% 94%    Last Pain:  Vitals:   09/04/19 1603  TempSrc:   PainSc: Asleep                 Precious Haws Linea Calles

## 2019-09-05 ENCOUNTER — Encounter: Payer: Self-pay | Admitting: Urology

## 2019-09-07 ENCOUNTER — Encounter: Payer: Self-pay | Admitting: Physician Assistant

## 2019-09-07 ENCOUNTER — Ambulatory Visit (INDEPENDENT_AMBULATORY_CARE_PROVIDER_SITE_OTHER): Payer: 59 | Admitting: Physician Assistant

## 2019-09-07 ENCOUNTER — Ambulatory Visit: Payer: 59 | Admitting: Physician Assistant

## 2019-09-07 ENCOUNTER — Other Ambulatory Visit: Payer: Self-pay

## 2019-09-07 DIAGNOSIS — N138 Other obstructive and reflux uropathy: Secondary | ICD-10-CM

## 2019-09-07 DIAGNOSIS — N401 Enlarged prostate with lower urinary tract symptoms: Secondary | ICD-10-CM

## 2019-09-07 LAB — BLADDER SCAN AMB NON-IMAGING: Scan Result: 33

## 2019-09-07 NOTE — Progress Notes (Signed)
Afternoon follow-up  Patient returned to clinic this afternoon for repeat PVR.  He reports drinking approximately 46oz of fluid.  He has been able to urinate and he has had urinary leakage.  PVR 33 mL.  Results for orders placed or performed in visit on 09/07/19  BLADDER SCAN AMB NON-IMAGING  Result Value Ref Range   Scan Result 33     Voiding trial passed.  Counseled patient on normal postoperative findings of gross hematuria, dysuria, and urinary leakage.  Instructed him on Kegel exercises and counseled him to perform these 3x10 sets daily for improvement in bladder control.  Written instructions also provided today.  I counseled the patient on signs and symptoms of gross hematuria requiring urgent evaluation, including thick, ketchup-like urinary output; passage of large clots; dark, maroon-colored urine; lower abdominal pain; lumbar pain; abdominal distention; and the inability to urinate.  I advised him to contact the office for assistance if he develops these symptoms during routine office hours, 8 AM to 5 PM Monday through Friday.  If outside those hours, I advised him to proceed to the emergency department. He expressed understanding.   Follow-up: 8-week postop follow-up with pathology results with Dr. Diamantina Providence (scheduled)

## 2019-09-07 NOTE — Progress Notes (Signed)
Fill and Pull Catheter Removal  Patient is present today for a catheter removal.  Patient was cleaned and prepped in a sterile fashion 316ml of sterile saline was instilled into the bladder when the patient felt the urge to urinate. 58ml of water was then drained from the balloon.  A 24FR three-way foley cath was removed from the bladder and patient had an immediate bladder spasm with an unmeasured efflux of pink urine.  Patient was then given some time to void on their own.  Patient can void  233ml on their own after some time.  Patient tolerated well.  Performed by: Debroah Loop, PA-C   Follow up/ Additional notes: Push fluids and RTC this afternoon for PVR.

## 2019-09-07 NOTE — Patient Instructions (Signed)

## 2019-09-09 ENCOUNTER — Telehealth: Payer: Self-pay | Admitting: *Deleted

## 2019-09-09 NOTE — Telephone Encounter (Addendum)
Patient called Triage line regarding ongoing leakage-counseled leaking after a HOLEP is normal and should improve. Too soon after surgery to prescribe medications per Lavone Neri, PA. Patient is aware.

## 2019-09-10 LAB — SURGICAL PATHOLOGY

## 2019-10-05 ENCOUNTER — Encounter: Payer: Self-pay | Admitting: Urology

## 2019-10-13 MED FILL — CITALOPRAM HBR 20 MG TABLET: 20 | 30 days supply | Qty: 30 | Fill #9

## 2019-10-28 ENCOUNTER — Ambulatory Visit (INDEPENDENT_AMBULATORY_CARE_PROVIDER_SITE_OTHER): Payer: 59 | Admitting: Urology

## 2019-10-28 ENCOUNTER — Encounter: Payer: Self-pay | Admitting: Urology

## 2019-10-28 ENCOUNTER — Other Ambulatory Visit: Payer: Self-pay

## 2019-10-28 VITALS — BP 130/80 | HR 73 | Ht 69.0 in | Wt 160.0 lb

## 2019-10-28 DIAGNOSIS — N401 Enlarged prostate with lower urinary tract symptoms: Secondary | ICD-10-CM | POA: Diagnosis not present

## 2019-10-28 DIAGNOSIS — N138 Other obstructive and reflux uropathy: Secondary | ICD-10-CM

## 2019-10-28 DIAGNOSIS — R972 Elevated prostate specific antigen [PSA]: Secondary | ICD-10-CM

## 2019-10-28 LAB — URINALYSIS, COMPLETE
Bilirubin, UA: NEGATIVE
Glucose, UA: NEGATIVE
Ketones, UA: NEGATIVE
Nitrite, UA: NEGATIVE
Specific Gravity, UA: >1.03 — ABNORMAL HIGH (ref 1.005–1.030)
Urobilinogen, Ur: 0.2 mg/dL (ref 0.2–1.0)
pH, UA: 5.5 (ref 5.0–7.5)

## 2019-10-28 LAB — MICROSCOPIC EXAMINATION
Bacteria, UA: NONE SEEN
WBC, UA: >30 /hpf — AB (ref 0–5)

## 2019-10-28 LAB — BLADDER SCAN AMB NON-IMAGING: Scan Result: 5

## 2019-10-28 NOTE — Progress Notes (Signed)
   10/28/2019 10:00 AM   Gillie Manners 19-Apr-1955 [id number]  Reason for visit: Follow up HoLEP  HPI: I saw Mr. Kops back in urology clinic for 6-week follow-up after HOLEP. Briefly, he is a 64 year old male with a history of elevated PSA and negative prostate biopsy who previously had been on CIC for incomplete emptying with intermittent retention requiring catheterization 4-5 times per week. Prostate was measured at 110 g, and cystoscopy showed significant prostatic obstruction. He opted for a HOLEP.  He underwent a HOLEP on 09/04/2019 with removal of 91 g of tissue. 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 Pathology showed rare atypical glands suspicious for carcinoma, but no definite malignancy, as well as BPH.  He reports he is overall been doing well since surgery. PVR is normal at 5 mL today. IPSS score is nine, with quality of life mixed. He continues to do Kegel exercises religiously. He has a couple of concerns today. He continues to have some incontinence with standing and physical activity requiring a depends. He continues to have some urgency and frequency, as well as some occasional dysuria after voiding and perineal discomfort. He is voiding with a very strong stream, and overall reports he is improved from before surgery.  Urinalysis today notable for 11-20 RBCs, greater than 30 WBCs, no bacteria. Will send for culture. On exam, he has no meatal stenosis and the meatus is widely patent, there is no genital tenderness.  We had an extensive conversation about expectations after HOLEP, especially in the setting of his chronic outlet obstruction and intermittent catheterization with significantly elevated PVRs in the past. He also had moderate bladder trabeculations intraoperatively. We discussed that it can take 2 to 3 months for the bladder to  adjust after HOLEP, and I anticipate his urgency, frequency, and leakage will continue to improve. I encouraged him to continue Kegel exercises and avoid bladder irritants. We also reviewed his pathology again, and the need for close monitoring of the PSA.  Repeat PSA in symptom check in 3 months Call with culture results  [name], [facility] [facility] [address] [place], [place] [address] [phone]

## 2019-11-03 LAB — CULTURE, URINE COMPREHENSIVE

## 2019-11-04 NOTE — Telephone Encounter (Signed)
-----   Message from [name], MD sent at 11/04/2019  8:41 AM EDT ----- No bacteria on urine culture, things will continue to improve after holep as discussed in clinic, follow up as scheduled  [name], MD 11/04/2019

## 2019-11-10 MED FILL — CITALOPRAM HBR 20 MG TABLET: 20 | 30 days supply | Qty: 30 | Fill #10

## 2019-12-05 MED FILL — CITALOPRAM HBR 20 MG TABLET: 20 | 30 days supply | Qty: 30 | Fill #11

## 2019-12-23 ENCOUNTER — Other Ambulatory Visit (HOSPITAL_COMMUNITY): Payer: Self-pay | Admitting: Psychiatry

## 2019-12-29 MED FILL — CITALOPRAM HBR 20 MG TABLET: 20 | 90 days supply | Qty: 90 | Fill #0

## 2020-01-25 ENCOUNTER — Other Ambulatory Visit: Payer: Self-pay | Admitting: Urology

## 2020-01-25 DIAGNOSIS — N401 Enlarged prostate with lower urinary tract symptoms: Secondary | ICD-10-CM | POA: Diagnosis not present

## 2020-01-25 DIAGNOSIS — N138 Other obstructive and reflux uropathy: Secondary | ICD-10-CM | POA: Diagnosis not present

## 2020-01-26 LAB — PSA: Prostate Specific Ag, Serum: 2.5 ng/mL (ref 0.0–4.0)

## 2020-01-28 ENCOUNTER — Encounter: Payer: Self-pay | Admitting: Urology

## 2020-01-28 ENCOUNTER — Other Ambulatory Visit: Payer: Self-pay

## 2020-01-28 ENCOUNTER — Ambulatory Visit (INDEPENDENT_AMBULATORY_CARE_PROVIDER_SITE_OTHER): Payer: 59 | Admitting: Urology

## 2020-01-28 VITALS — BP 126/73 | HR 77 | Ht 69.0 in | Wt 160.0 lb

## 2020-01-28 DIAGNOSIS — N138 Other obstructive and reflux uropathy: Secondary | ICD-10-CM

## 2020-01-28 DIAGNOSIS — N401 Enlarged prostate with lower urinary tract symptoms: Secondary | ICD-10-CM | POA: Diagnosis not present

## 2020-01-28 DIAGNOSIS — R972 Elevated prostate specific antigen [PSA]: Secondary | ICD-10-CM

## 2020-01-28 LAB — BLADDER SCAN AMB NON-IMAGING

## 2020-01-28 NOTE — Progress Notes (Signed)
   01/28/2020 12:52 PM   Keith Beard February 10, 1956 [id number]  Reason for visit: Follow up BPH status post HOLEP, elevated PSA  HPI: I saw Keith Beard back in urology clinic for the above issues.  Briefly he is a healthy 64 year old male with ED, history of elevated PSA to 8.6 and obstructive BPH symptoms requiring intermittent CIC.  He underwent a negative prostate biopsy in 2017 for a PSA of 4 that showed a 111 g prostate.  IPSS score preop was 24.  He underwent a HOLEP on 09/04/2019 with removal of 91 g of tissue.  This did show rare atypical glands suspicious for carcinoma(<1% of tissue), but no definitive prostate cancer.  Since surgery, he has continued to improve over the last few months.  He originally had some urgency, frequency and incontinence over the first few weeks that have improved significantly.  He is still wearing a depends, but having minimal stress leakage during the day.  He is not leaking at all overnight.  He is urinating with a strong stream and not having any urgency or urge incontinence.  IPSS score today is 3, with quality of life mostly satisfied.  PVR is normal at 12 mL.  He has not had to catheterize at all since surgery.  Overall, he feels he is improved significantly since before surgery and is happy with his outcome.  Recent PSA on 01/25/2020 had dropped down to 2.5 from 8.6 preop.  Regarding his urinary symptoms, I do expect that his leakage will continue to improve.  We reviewed Kegel exercises and avoiding bladder irritants.  I also counseled him that quitting smoking may improve some of his urinary symptoms.  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  We reviewed a Cunningham clamp as an option in the future if things do not improve.  Regarding his pathology findings, we discussed the need to continue to monitor the PSA.  He has had an  appropriate drop down to 2.5 from 8.6 preop, I recommended repeating a PSA in 6 months.  If this is stable, likely can continue yearly screening, however if PSA rose significantly would need to consider biopsy of the peripheral zone or prostate MRI.  He would like to resume care with Dr. Diona Fanti as he lives in [place], but I am happy to see him in the future if he has any worsening of his urinary symptoms or other questions.  -Resume regular care with Dr. Diona Fanti in [place], [place] via [facility]-recommended 19-month follow-up with PSA prior -Virtual visit 7 months with me for symptom check/follow-up PSA findings  [name], MD  [facility] [address] [place], [place] [address] [phone]

## 2020-04-06 DIAGNOSIS — B0233 Zoster keratitis: Secondary | ICD-10-CM | POA: Diagnosis not present

## 2020-04-06 DIAGNOSIS — H1131 Conjunctival hemorrhage, right eye: Secondary | ICD-10-CM | POA: Diagnosis not present

## 2020-05-23 MED FILL — CITALOPRAM HBR 20 MG TABLET: 20 | 90 days supply | Qty: 90 | Fill #1

## 2020-06-01 DIAGNOSIS — N5201 Erectile dysfunction due to arterial insufficiency: Secondary | ICD-10-CM | POA: Diagnosis not present

## 2020-06-01 DIAGNOSIS — N401 Enlarged prostate with lower urinary tract symptoms: Secondary | ICD-10-CM | POA: Diagnosis not present

## 2020-06-01 DIAGNOSIS — R972 Elevated prostate specific antigen [PSA]: Secondary | ICD-10-CM | POA: Diagnosis not present

## 2020-06-01 DIAGNOSIS — N486 Induration penis plastica: Secondary | ICD-10-CM | POA: Diagnosis not present

## 2020-06-01 DIAGNOSIS — R3912 Poor urinary stream: Secondary | ICD-10-CM | POA: Diagnosis not present

## 2020-08-16 ENCOUNTER — Telehealth (INDEPENDENT_AMBULATORY_CARE_PROVIDER_SITE_OTHER): Payer: 59 | Admitting: Urology

## 2020-08-16 ENCOUNTER — Other Ambulatory Visit: Payer: Self-pay

## 2020-08-16 DIAGNOSIS — N138 Other obstructive and reflux uropathy: Secondary | ICD-10-CM

## 2020-08-16 DIAGNOSIS — N401 Enlarged prostate with lower urinary tract symptoms: Secondary | ICD-10-CM

## 2020-08-16 NOTE — Progress Notes (Signed)
Virtual Visit via Telephone Note  I connected with Keith Beard on 08/16/20 at 11:15 AM EDT by telephone and verified that I am speaking with the correct person using two identifiers.   Patient location: Home Provider location: [facility]   I discussed the limitations, risks, security and privacy concerns of performing an evaluation and management service by telephone and the availability of in person appointments. We discussed the impact of the COVID-19 pandemic on the healthcare system, and the importance of social distancing and reducing patient and provider exposure. I also discussed with the patient that there may be a patient responsible charge related to this service. The patient expressed understanding and agreed to proceed.  Reason for visit: Follow up BPH status post HOLEP, elevated PSA  History of Present Illness: He is a healthy 65 year old male with ED, history of elevated PSA to 8.6 and obstructive BPH symptoms requiring CIC.  He underwent a negative prostate biopsy in 2017 for a PSA of 4 that showed a 111 g prostate.  IPSS score preop was 24.  He underwent a HOLEP on 09/04/2019 with removal of 91 g of tissue.  This did show rare atypical glands suspicious for carcinoma(<1% of tissue), but no definitive prostate cancer.  PSA postop on 01/25/2020 had dropped down to 2.5 from 8.6 preop.  PVR is normal at 12 mL at her last follow-up in November 2021, but at that time he was still having some stress incontinence and requiring depends.  He reports that has resolved over the last 6 months and really denies any urinary complaints today.  He is urinating with a strong stream and not having any significant leakage.  He is satisfied with his urinary symptoms at this time.  He resumed care with Dr. Diona Fanti in [place] who was his original urologist who referred him to me for HOLEP.  We again reviewed his pathology findings and the drop in PSA, and I recommended continuing  PSA monitoring with the atypical glands found on HOLEP.  We discussed that if the PSA rose significantly in the future could consider prostate MRI or prostate biopsy of remaining peripheral zone.  He will get a PSA drawn with his PCP in the next few months, and continue urology follow-up with Dr. Diona Fanti.   I am happy to see him again in the future if needed Resume care with Dr. Diona Fanti in [place]    I discussed the assessment and treatment plan with the patient. The patient was provided an opportunity to ask questions and all were answered. The patient agreed with the plan and demonstrated an understanding of the instructions.   The patient was advised to call back or seek an in-person evaluation if the symptoms worsen or if the condition fails to improve as anticipated.   [name], MD

## 2020-08-22 MED FILL — Citalopram Hydrobromide Tab 20 MG (Base Equiv): ORAL | 90 days supply | Qty: 90 | Fill #0 | Status: AC

## 2020-08-23 ENCOUNTER — Other Ambulatory Visit (HOSPITAL_COMMUNITY): Payer: Self-pay

## 2020-08-25 ENCOUNTER — Other Ambulatory Visit (HOSPITAL_COMMUNITY): Payer: Self-pay

## 2020-08-29 ENCOUNTER — Other Ambulatory Visit (HOSPITAL_COMMUNITY): Payer: Self-pay

## 2020-08-29 MED ORDER — CARESTART COVID-19 HOME TEST VI KIT
PACK | 0 refills | Status: DC
  Filled 2020-08-29: qty 4, 4d supply, fill #0

## 2020-09-23 ENCOUNTER — Other Ambulatory Visit (HOSPITAL_COMMUNITY): Payer: Self-pay

## 2020-09-23 DIAGNOSIS — R030 Elevated blood-pressure reading, without diagnosis of hypertension: Secondary | ICD-10-CM | POA: Diagnosis not present

## 2020-09-23 DIAGNOSIS — N400 Enlarged prostate without lower urinary tract symptoms: Secondary | ICD-10-CM | POA: Diagnosis not present

## 2020-09-23 DIAGNOSIS — Z13220 Encounter for screening for lipoid disorders: Secondary | ICD-10-CM | POA: Diagnosis not present

## 2020-09-23 DIAGNOSIS — N529 Male erectile dysfunction, unspecified: Secondary | ICD-10-CM | POA: Diagnosis not present

## 2020-09-23 DIAGNOSIS — R002 Palpitations: Secondary | ICD-10-CM | POA: Diagnosis not present

## 2020-09-23 DIAGNOSIS — Z0000 Encounter for general adult medical examination without abnormal findings: Secondary | ICD-10-CM | POA: Diagnosis not present

## 2020-09-23 DIAGNOSIS — Z23 Encounter for immunization: Secondary | ICD-10-CM | POA: Diagnosis not present

## 2020-09-23 DIAGNOSIS — M79646 Pain in unspecified finger(s): Secondary | ICD-10-CM | POA: Diagnosis not present

## 2020-09-23 MED ORDER — MELOXICAM 15 MG PO TABS
ORAL_TABLET | ORAL | 5 refills | Status: DC
  Filled 2020-09-23: qty 30, 30d supply, fill #0

## 2020-10-23 ENCOUNTER — Telehealth: Payer: 59 | Admitting: Family

## 2020-10-23 DIAGNOSIS — B880 Other acariasis: Secondary | ICD-10-CM | POA: Diagnosis not present

## 2020-10-23 DIAGNOSIS — R21 Rash and other nonspecific skin eruption: Secondary | ICD-10-CM | POA: Diagnosis not present

## 2020-10-23 MED ORDER — TRIAMCINOLONE ACETONIDE 0.5 % EX OINT
1.0000 "application " | TOPICAL_OINTMENT | Freq: Two times a day (BID) | CUTANEOUS | 0 refills | Status: DC
  Filled 2020-10-23: qty 30, 15d supply, fill #0

## 2020-10-23 MED ORDER — HYDROXYZINE PAMOATE 25 MG PO CAPS
25.0000 mg | ORAL_CAPSULE | Freq: Three times a day (TID) | ORAL | 0 refills | Status: DC | PRN
  Filled 2020-10-23: qty 30, 10d supply, fill #0

## 2020-10-23 NOTE — Progress Notes (Signed)
E Visit for Rash  We are sorry that you are not feeling well. Here is how we plan to help!  It appears like some type of bite (chiggers? Flea?). I have prescribed you kenalog cream, a steroid cream. I have also sent in Vistaril 25 mg you can have three times a day for itching.    HOME CARE:  Take cool showers and avoid direct sunlight. Apply cool compress or wet dressings. Take a bath in an oatmeal bath.  Sprinkle content of one Aveeno packet under running faucet with comfortably warm water.  Bathe for 15-20 minutes, 1-2 times daily.  Pat dry with a towel. Do not rub the rash. Use hydrocortisone cream. Take an antihistamine like Benadryl for widespread rashes that itch.  The adult dose of Benadryl is 25-50 mg by mouth 4 times daily. Caution:  This type of medication may cause sleepiness.  Do not drink alcohol, drive, or operate dangerous machinery while taking antihistamines.  Do not take these medications if you have prostate enlargement.  Read package instructions thoroughly on all medications that you take.  GET HELP RIGHT AWAY IF:  Symptoms don't go away after treatment. Severe itching that persists. If you rash spreads or swells. If you rash begins to smell. If it blisters and opens or develops a yellow-brown crust. You develop a fever. You have a sore throat. You become short of breath.  MAKE SURE YOU:  Understand these instructions. Will watch your condition. Will get help right away if you are not doing well or get worse.  Thank you for choosing an e-visit.  Your e-visit answers were reviewed by a board certified advanced clinical practitioner to complete your personal care plan. Depending upon the condition, your plan could have included both over the counter or prescription medications.  Please review your pharmacy choice. Make sure the pharmacy is open so you can pick up prescription now. If there is a problem, you may contact your provider through [employer]  and have the prescription routed to another pharmacy.  Your safety is important to [place]. If you have drug allergies check your prescription carefully.   For the next 24 hours you can use MyChart to ask questions about today's visit, request a non-urgent call back, or ask for a work or school excuse. You will get an email in the next two days asking about your experience. I hope that your e-visit has been valuable and will speed your recovery.  Approximately 5 minutes was spent documenting and reviewing patient's chart.

## 2020-10-24 ENCOUNTER — Other Ambulatory Visit (HOSPITAL_COMMUNITY): Payer: Self-pay

## 2020-11-20 MED FILL — Citalopram Hydrobromide Tab 20 MG (Base Equiv): ORAL | 90 days supply | Qty: 90 | Fill #1 | Status: CN

## 2020-11-21 ENCOUNTER — Other Ambulatory Visit (HOSPITAL_COMMUNITY): Payer: Self-pay

## 2020-11-21 ENCOUNTER — Other Ambulatory Visit (HOSPITAL_COMMUNITY): Payer: Self-pay | Admitting: Psychiatry

## 2020-11-23 ENCOUNTER — Other Ambulatory Visit (HOSPITAL_COMMUNITY): Payer: Self-pay

## 2020-11-23 MED ORDER — CITALOPRAM HYDROBROMIDE 20 MG PO TABS
20.0000 mg | ORAL_TABLET | Freq: Every morning | ORAL | 1 refills | Status: DC
Start: 2020-11-23 — End: 2022-06-06
  Filled 2020-11-23: qty 90, 90d supply, fill #0
  Filled 2021-02-23: qty 90, 90d supply, fill #1

## 2020-11-24 ENCOUNTER — Other Ambulatory Visit (HOSPITAL_COMMUNITY): Payer: Self-pay

## 2020-11-30 DIAGNOSIS — Z23 Encounter for immunization: Secondary | ICD-10-CM | POA: Diagnosis not present

## 2021-01-30 ENCOUNTER — Other Ambulatory Visit (HOSPITAL_COMMUNITY): Payer: Self-pay

## 2021-01-30 DIAGNOSIS — J209 Acute bronchitis, unspecified: Secondary | ICD-10-CM | POA: Diagnosis not present

## 2021-01-30 DIAGNOSIS — J019 Acute sinusitis, unspecified: Secondary | ICD-10-CM | POA: Diagnosis not present

## 2021-01-30 MED ORDER — AMOXICILLIN-POT CLAVULANATE 875-125 MG PO TABS
ORAL_TABLET | ORAL | 0 refills | Status: DC
  Filled 2021-01-30: qty 14, 7d supply, fill #0

## 2021-02-06 DIAGNOSIS — J0100 Acute maxillary sinusitis, unspecified: Secondary | ICD-10-CM | POA: Diagnosis not present

## 2021-02-06 DIAGNOSIS — J209 Acute bronchitis, unspecified: Secondary | ICD-10-CM | POA: Diagnosis not present

## 2021-02-07 ENCOUNTER — Other Ambulatory Visit (HOSPITAL_COMMUNITY): Payer: Self-pay

## 2021-02-07 MED ORDER — AMOXICILLIN-POT CLAVULANATE 875-125 MG PO TABS
ORAL_TABLET | ORAL | 0 refills | Status: DC
  Filled 2021-02-07: qty 6, 3d supply, fill #0

## 2021-02-07 MED ORDER — PREDNISONE 20 MG PO TABS
ORAL_TABLET | ORAL | 0 refills | Status: DC
  Filled 2021-02-07: qty 14, 7d supply, fill #0

## 2021-02-07 MED ORDER — AZITHROMYCIN 250 MG PO TABS
ORAL_TABLET | ORAL | 0 refills | Status: DC
  Filled 2021-02-07: qty 6, 5d supply, fill #0

## 2021-02-23 ENCOUNTER — Other Ambulatory Visit (HOSPITAL_COMMUNITY): Payer: Self-pay

## 2021-02-24 ENCOUNTER — Other Ambulatory Visit (HOSPITAL_COMMUNITY): Payer: Self-pay

## 2021-05-31 ENCOUNTER — Other Ambulatory Visit (HOSPITAL_COMMUNITY): Payer: Self-pay

## 2021-05-31 MED ORDER — CITALOPRAM HYDROBROMIDE 20 MG PO TABS
ORAL_TABLET | ORAL | 11 refills | Status: DC
  Filled 2021-05-31: qty 30, 30d supply, fill #0
  Filled 2021-06-28: qty 30, 30d supply, fill #1
  Filled 2021-07-31: qty 30, 30d supply, fill #2
  Filled 2021-08-28: qty 30, 30d supply, fill #3
  Filled 2021-09-25: qty 30, 30d supply, fill #4
  Filled 2021-10-30: qty 30, 30d supply, fill #5
  Filled 2021-11-28: qty 30, 30d supply, fill #6
  Filled 2021-12-27: qty 30, 30d supply, fill #7
  Filled 2022-02-01: qty 5, 5d supply, fill #8
  Filled 2022-02-02: qty 25, 25d supply, fill #8
  Filled 2022-03-07: qty 30, 30d supply, fill #9
  Filled 2022-04-05: qty 30, 30d supply, fill #10
  Filled 2022-05-11: qty 30, 30d supply, fill #11

## 2021-06-29 ENCOUNTER — Other Ambulatory Visit (HOSPITAL_COMMUNITY): Payer: Self-pay

## 2021-08-01 ENCOUNTER — Other Ambulatory Visit (HOSPITAL_COMMUNITY): Payer: Self-pay

## 2021-08-29 ENCOUNTER — Other Ambulatory Visit (HOSPITAL_COMMUNITY): Payer: Self-pay

## 2021-09-10 DIAGNOSIS — H524 Presbyopia: Secondary | ICD-10-CM | POA: Diagnosis not present

## 2021-09-26 ENCOUNTER — Other Ambulatory Visit (HOSPITAL_COMMUNITY): Payer: Self-pay

## 2021-09-29 ENCOUNTER — Other Ambulatory Visit (HOSPITAL_COMMUNITY): Payer: Self-pay

## 2021-09-29 DIAGNOSIS — M1990 Unspecified osteoarthritis, unspecified site: Secondary | ICD-10-CM | POA: Diagnosis not present

## 2021-09-29 DIAGNOSIS — F17200 Nicotine dependence, unspecified, uncomplicated: Secondary | ICD-10-CM | POA: Diagnosis not present

## 2021-09-29 DIAGNOSIS — Z125 Encounter for screening for malignant neoplasm of prostate: Secondary | ICD-10-CM | POA: Diagnosis not present

## 2021-09-29 DIAGNOSIS — Z122 Encounter for screening for malignant neoplasm of respiratory organs: Secondary | ICD-10-CM | POA: Diagnosis not present

## 2021-09-29 DIAGNOSIS — Z0000 Encounter for general adult medical examination without abnormal findings: Secondary | ICD-10-CM | POA: Diagnosis not present

## 2021-09-29 DIAGNOSIS — Z13220 Encounter for screening for lipoid disorders: Secondary | ICD-10-CM | POA: Diagnosis not present

## 2021-09-29 DIAGNOSIS — R5382 Chronic fatigue, unspecified: Secondary | ICD-10-CM | POA: Diagnosis not present

## 2021-09-29 MED ORDER — MELOXICAM 15 MG PO TABS
ORAL_TABLET | ORAL | 1 refills | Status: DC
  Filled 2021-09-29 – 2021-10-17 (×2): qty 30, 30d supply, fill #0
  Filled 2022-04-30: qty 30, 30d supply, fill #1

## 2021-10-01 DIAGNOSIS — K4090 Unilateral inguinal hernia, without obstruction or gangrene, not specified as recurrent: Secondary | ICD-10-CM | POA: Diagnosis not present

## 2021-10-03 ENCOUNTER — Other Ambulatory Visit: Payer: Self-pay | Admitting: Family Medicine

## 2021-10-03 DIAGNOSIS — Z122 Encounter for screening for malignant neoplasm of respiratory organs: Secondary | ICD-10-CM

## 2021-10-17 ENCOUNTER — Other Ambulatory Visit (HOSPITAL_COMMUNITY): Payer: Self-pay

## 2021-10-31 ENCOUNTER — Other Ambulatory Visit (HOSPITAL_COMMUNITY): Payer: Self-pay

## 2021-11-02 DIAGNOSIS — K4090 Unilateral inguinal hernia, without obstruction or gangrene, not specified as recurrent: Secondary | ICD-10-CM | POA: Diagnosis not present

## 2021-11-07 DIAGNOSIS — Z23 Encounter for immunization: Secondary | ICD-10-CM | POA: Diagnosis not present

## 2021-11-14 DIAGNOSIS — R972 Elevated prostate specific antigen [PSA]: Secondary | ICD-10-CM | POA: Diagnosis not present

## 2021-11-29 ENCOUNTER — Other Ambulatory Visit (HOSPITAL_COMMUNITY): Payer: Self-pay

## 2021-12-28 ENCOUNTER — Other Ambulatory Visit (HOSPITAL_COMMUNITY): Payer: Self-pay

## 2022-01-04 ENCOUNTER — Other Ambulatory Visit (HOSPITAL_COMMUNITY): Payer: Self-pay

## 2022-01-04 DIAGNOSIS — G8918 Other acute postprocedural pain: Secondary | ICD-10-CM | POA: Diagnosis not present

## 2022-01-04 DIAGNOSIS — K4090 Unilateral inguinal hernia, without obstruction or gangrene, not specified as recurrent: Secondary | ICD-10-CM | POA: Diagnosis not present

## 2022-01-04 MED ORDER — IBUPROFEN 800 MG PO TABS
800.0000 mg | ORAL_TABLET | Freq: Three times a day (TID) | ORAL | 0 refills | Status: DC | PRN
  Filled 2022-01-04: qty 30, 10d supply, fill #0

## 2022-01-04 MED ORDER — OXYCODONE HCL 5 MG PO TABS
ORAL_TABLET | ORAL | 0 refills | Status: DC
  Filled 2022-01-04: qty 12, 3d supply, fill #0

## 2022-01-19 ENCOUNTER — Other Ambulatory Visit (HOSPITAL_COMMUNITY): Payer: Self-pay

## 2022-01-19 MED ORDER — GABAPENTIN 100 MG PO CAPS
100.0000 mg | ORAL_CAPSULE | Freq: Three times a day (TID) | ORAL | 0 refills | Status: DC
  Filled 2022-01-19: qty 30, 10d supply, fill #0

## 2022-01-26 ENCOUNTER — Other Ambulatory Visit (HOSPITAL_COMMUNITY): Payer: Self-pay

## 2022-01-26 MED ORDER — GABAPENTIN 100 MG PO CAPS
100.0000 mg | ORAL_CAPSULE | Freq: Three times a day (TID) | ORAL | 1 refills | Status: DC
  Filled 2022-01-26 – 2022-01-27 (×3): qty 90, 30d supply, fill #0

## 2022-01-27 ENCOUNTER — Other Ambulatory Visit (HOSPITAL_COMMUNITY): Payer: Self-pay

## 2022-02-02 ENCOUNTER — Other Ambulatory Visit (HOSPITAL_COMMUNITY): Payer: Self-pay

## 2022-02-05 ENCOUNTER — Other Ambulatory Visit (HOSPITAL_COMMUNITY): Payer: Self-pay

## 2022-02-05 DIAGNOSIS — R3912 Poor urinary stream: Secondary | ICD-10-CM | POA: Diagnosis not present

## 2022-02-05 DIAGNOSIS — N401 Enlarged prostate with lower urinary tract symptoms: Secondary | ICD-10-CM | POA: Diagnosis not present

## 2022-02-12 DIAGNOSIS — R972 Elevated prostate specific antigen [PSA]: Secondary | ICD-10-CM | POA: Diagnosis not present

## 2022-02-12 DIAGNOSIS — N486 Induration penis plastica: Secondary | ICD-10-CM | POA: Diagnosis not present

## 2022-02-12 DIAGNOSIS — N5201 Erectile dysfunction due to arterial insufficiency: Secondary | ICD-10-CM | POA: Diagnosis not present

## 2022-04-09 ENCOUNTER — Other Ambulatory Visit (HOSPITAL_COMMUNITY): Payer: Self-pay

## 2022-05-09 ENCOUNTER — Ambulatory Visit
Admission: RE | Admit: 2022-05-09 | Discharge: 2022-05-09 | Disposition: A | Discharge status: Home / Self Care | Payer: 59 | Source: Ambulatory Visit | Attending: Family Medicine | Admitting: Family Medicine

## 2022-05-09 DIAGNOSIS — Z122 Encounter for screening for malignant neoplasm of respiratory organs: Secondary | ICD-10-CM

## 2022-05-09 DIAGNOSIS — Z87891 Personal history of nicotine dependence: Secondary | ICD-10-CM | POA: Diagnosis not present

## 2022-05-11 ENCOUNTER — Other Ambulatory Visit (HOSPITAL_COMMUNITY): Payer: Self-pay | Admitting: Family Medicine

## 2022-05-11 DIAGNOSIS — K769 Liver disease, unspecified: Secondary | ICD-10-CM

## 2022-05-12 ENCOUNTER — Ambulatory Visit (HOSPITAL_BASED_OUTPATIENT_CLINIC_OR_DEPARTMENT_OTHER): Payer: 59

## 2022-05-14 ENCOUNTER — Ambulatory Visit: Payer: 59

## 2022-05-14 DIAGNOSIS — K7689 Other specified diseases of liver: Secondary | ICD-10-CM | POA: Diagnosis not present

## 2022-05-14 DIAGNOSIS — K769 Liver disease, unspecified: Secondary | ICD-10-CM

## 2022-05-14 DIAGNOSIS — R972 Elevated prostate specific antigen [PSA]: Secondary | ICD-10-CM | POA: Diagnosis not present

## 2022-05-14 DIAGNOSIS — N281 Cyst of kidney, acquired: Secondary | ICD-10-CM | POA: Diagnosis not present

## 2022-05-14 MED ORDER — GADOBUTROL 1 MMOL/ML IV SOLN
7.5000 mL | Freq: Once | INTRAVENOUS | Status: AC | PRN
  Administered 2022-05-14: 7.5 mL via INTRAVENOUS

## 2022-05-31 ENCOUNTER — Ambulatory Visit (INDEPENDENT_AMBULATORY_CARE_PROVIDER_SITE_OTHER): Payer: 59 | Admitting: Pulmonary Disease

## 2022-05-31 ENCOUNTER — Encounter (HOSPITAL_BASED_OUTPATIENT_CLINIC_OR_DEPARTMENT_OTHER): Payer: Self-pay | Admitting: Pulmonary Disease

## 2022-05-31 VITALS — BP 120/78 | HR 84 | Ht 68.0 in | Wt 162.2 lb

## 2022-05-31 DIAGNOSIS — Z720 Tobacco use: Secondary | ICD-10-CM | POA: Diagnosis not present

## 2022-05-31 DIAGNOSIS — J432 Centrilobular emphysema: Secondary | ICD-10-CM | POA: Diagnosis not present

## 2022-05-31 DIAGNOSIS — R911 Solitary pulmonary nodule: Secondary | ICD-10-CM

## 2022-05-31 NOTE — Patient Instructions (Addendum)
Left lower lobe lung nodule --ORDER CT Chest without contrast in 3 months (June 2023)  Emphysema --No indication bronchodilators --Consider PFTs in the future  Tobacco abuse Patient is an active smoker. --START nicotine patches

## 2022-05-31 NOTE — Progress Notes (Signed)
Subjective:   PATIENT ID: Keith Beard GENDER: male DOB: November 01, 1955, MRN: [id number]  Chief Complaint  Patient presents with   Consult    Lung nodule    Reason for Visit: New consult for lung nodules  [name] is a 67 year old male active smoker with allergic rhinitis, childhood asthma, BPH and anxiety who presents for evaluation for pulmonary nodule.  He was referred by his PCP, NP [facility] for bilateral pulmonary nodules and tobacco use. He recently completed CT lung screen which demonstrated emphysema and scattered bilateral pulmonary nodules with largest measuring ~26mm. He denies respiratory symptoms. No shortness of breath, cough, wheezing. Did have a cough associated with respiratory illness but this has resolved. No limitations in activity related to breathing. No reported weight loss, unexplained fevers/chills or night sweats on ROS. PCP note comments on work-up for chronic fatigue with normal TSH, vitamin D on records faxed to our office. He is an active smoker and has considered quitting.  Social History: Active smoker. 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  Past Medical History:  Diagnosis Date   Anxiety    Asthma    childhood   BPH (benign prostatic hyperplasia)      Family History  Problem Relation Age of Onset   Hypertension Father    Colon cancer Neg Hx    Colon polyps Neg Hx    Esophageal cancer Neg Hx    Stomach cancer Neg Hx    Rectal cancer Neg Hx    Prostate cancer Neg Hx    Kidney cancer Neg Hx    Bladder Cancer Neg Hx      Social History   Occupational History   Not on file  Tobacco Use   Smoking status: Every Day    Packs/day: 1    Types: Cigarettes   Smokeless tobacco: Never   Tobacco comments:    Started  smoking age 89 about a pack   Vaping Use   Vaping Use: Never used  Substance and Sexual Activity   Alcohol use: Yes    Alcohol/week: 4.0 - 5.0 standard drinks of alcohol    Types: 4 - 5 Cans of beer per week   Drug use: Never   Sexual activity: Yes    No Known Allergies   Outpatient Medications Prior to Visit  Medication Sig Dispense Refill   citalopram (CELEXA) 20 MG tablet Take 20 mg by mouth at bedtime.   11   meloxicam (MOBIC) 15 MG tablet Take 1 tablet by mouth Once a day as directed 30 tablet 5   Multiple Vitamin (MULTIVITAMIN) tablet Take 1 tablet by mouth daily.     tadalafil (CIALIS) 10 MG tablet Take 10 mg by mouth daily as needed for erectile dysfunction.     vitamin B-12 (CYANOCOBALAMIN) 100 MCG tablet Take 100 mcg by mouth daily.     vitamin C (ASCORBIC ACID) 500 MG tablet Take 500 mg by mouth daily.     vitamin E 400 UNIT capsule Take 400 Units by mouth daily.     amoxicillin-clavulanate (AUGMENTIN) 875-125 MG tablet Take 1 tablet by mouth every 12 hrs for 3 days (Patient not taking: Reported on 05/31/2022) 6 tablet 0   azithromycin (ZITHROMAX) 250 MG tablet Take 2 tablets by mouth on the first day, then 1 tablet by mouth  daily for 4 days. (Patient not taking: Reported on 05/31/2022) 6 tablet 0   citalopram (CELEXA) 20 MG tablet TAKE 1 TABLET BY MOUTH EVERY MORNING 330 tablet 0   citalopram (CELEXA) 20 MG tablet Take 1 tablet (20 mg total) by mouth every morning. (Patient not taking: Reported on 05/31/2022) 90 tablet 1   citalopram (CELEXA) 20 MG tablet Take 1 tablet by mouth in the morning (Patient not taking: Reported on 05/31/2022) 30 tablet 11   COVID-19 At Home Antigen Test (CARESTART COVID-19 HOME TEST) KIT Use as directed (Patient not taking: Reported on 05/31/2022) 4 each 0   gabapentin (NEURONTIN) 100 MG capsule Take 1 capsule (100 mg total) by mouth 3 (three) times daily. (Patient not taking: Reported on 05/31/2022) 90 capsule 1   Ginkgo Biloba 40 MG TABS Take 40 mg by  mouth daily. (Patient not taking: Reported on 05/31/2022)     hydrOXYzine (VISTARIL) 25 MG capsule Take 1 capsule (25 mg total) by mouth 3 (three) times daily as needed. (Patient not taking: Reported on 05/31/2022) 30 capsule 0   ibuprofen (ADVIL) 800 MG tablet Take 1 tablet (800 mg total) by mouth every 8 (eight) hours as needed for pain. (Patient not taking: Reported on 05/31/2022) 30 tablet 0   meloxicam (MOBIC) 15 MG tablet Take 1 tablet by mouth once a day (Patient not taking: Reported on 05/31/2022) 30 tablet 1   oxyCODONE (OXY IR/ROXICODONE) 5 MG immediate release tablet Take 1 tablet (5 mg total) by mouth every 4 (four) hours as needed for pain (Patient not taking: Reported on 05/31/2022) 12 tablet 0   predniSONE (DELTASONE) 20 MG tablet Take 2 tablets by mouth once a day for 7 days. (Patient not taking: Reported on 05/31/2022) 14 tablet 0   triamcinolone ointment (KENALOG) 0.5 % Apply 1 application topically 2 (two) times daily. (Patient not taking: Reported on 05/31/2022) 30 g 0   No facility-administered medications prior to visit.    Review of Systems  Constitutional:  Negative for chills, diaphoresis, fever, malaise/fatigue and weight loss.  HENT:  Positive for congestion.   Respiratory:  Positive for cough. Negative for hemoptysis, sputum production, shortness of breath and wheezing.   Cardiovascular:  Negative for chest pain, palpitations and leg swelling.     Objective:   Vitals:   05/31/22 1540  BP: 120/78  Pulse: 84  SpO2: 94%  Weight: 162 lb 3.2 oz (73.6 kg)  Height: 5\' 8"  (1.727 m)   SpO2: 94 % O2 Device: None (Room air)  Physical Exam: General: Well-appearing, no acute distress HENT: [place], AT Eyes: EOMI, no scleral icterus Respiratory: Clear to auscultation bilaterally.  No crackles, wheezing or rales Cardiovascular: RRR, -M/R/G, no JVD Extremities:-Edema,-tenderness Neuro: AAO x4, CNII-XII grossly intact Psych: Normal mood, normal affect  Data  Reviewed:  Imaging: CT lung screen 05/09/22 - Centrilobular and paraseptal emphysema. Scattered pulmonary nodules bilateral. 9.3 mm in left lower lobe  PFT: None on file  Labs: CBC    Component Value Date/Time   WBC 8.7 11/22/2017 0802   RBC 4.44 11/22/2017 0802   HGB 14.1 11/22/2017 0802   HCT 41.5 11/22/2017 0802   PLT 263 11/22/2017 0802   MCV 93.5 11/22/2017 0802   MCH 31.8 11/22/2017 0802   MCHC 34.0 11/22/2017 0802   RDW 13.6 11/22/2017 0802   LYMPHSABS 1.7 11/22/2017 0802   MONOABS 0.5 11/22/2017 0802   EOSABS 0.1 11/22/2017 0802   BASOSABS 0.0 11/22/2017 0802   Absolute eos 11/22/17 - 100  Assessment & Plan:   Discussion: 67 year old male active smoker with allergic rhinitis, childhood asthma, BPH and anxiety who presents for evaluation for pulmonary nodule. CT imaging reviewed with scattered bilateral pulmonary nodules, larges ~38mm in LLL. Emphysema also seen on scan however patient asymptomatic from a respiratory standpoint. We discussed extensively the risks of malignancy (8.7% on SPN Mayo calculator) and discussed surveillance imaging vs diagnostic testing. I recommended repeat imaging based on low risk but given size, low threshold to consider PET in future. Counseled extensively on smoking cessation and approaches to decrease cigarette intake.  Left lower lobe lung nodule --ORDER CT Chest without contrast in 3 months (June 2023)  Emphysema --No indication bronchodilators --Consider PFTs in the future  Tobacco abuse Patient is an active smoker. We discussed smoking cessation for >10 minutes. We discussed triggers and stressors and ways to deal with them. We discussed barriers to continued smoking and benefits of smoking cessation. Provided patient with information cessation techniques and interventions including [place] quitline. --START nicotine patches  Health Maintenance Immunization History  Administered Date(s) Administered   Influenza,inj,Quad PF,6+ Mos  12/20/2018   PFIZER(Purple Top)SARS-COV-2 Vaccination 05/22/2019, 06/17/2019   CT Lung Screen - not qualified  Orders Placed This Encounter  Procedures   CT Chest Wo Contrast    Standing Status:   Future    Standing Expiration Date:   05/31/2023    Scheduling Instructions:     Schedule in 3 months    Order Specific Question:   Preferred imaging location?    Answer:   [facility]  No orders of the defined types were placed in this encounter.   Return in about 3 months (around 08/31/2022).  I have spent a total time of 45-minutes on the day of the appointment reviewing prior documentation, coordinating care and discussing medical diagnosis and plan with the patient/family. Imaging, labs and tests included in this note have been reviewed and interpreted independently by me.  [place], MD [facility] 05/31/2022 3:51 PM  Office Number [phone]

## 2022-06-06 ENCOUNTER — Encounter (HOSPITAL_BASED_OUTPATIENT_CLINIC_OR_DEPARTMENT_OTHER): Payer: Self-pay | Admitting: Pulmonary Disease

## 2022-06-07 ENCOUNTER — Other Ambulatory Visit (HOSPITAL_COMMUNITY): Payer: Self-pay

## 2022-06-07 DIAGNOSIS — M5441 Lumbago with sciatica, right side: Secondary | ICD-10-CM | POA: Diagnosis not present

## 2022-06-07 DIAGNOSIS — M25551 Pain in right hip: Secondary | ICD-10-CM | POA: Diagnosis not present

## 2022-06-07 DIAGNOSIS — G8929 Other chronic pain: Secondary | ICD-10-CM | POA: Diagnosis not present

## 2022-06-07 DIAGNOSIS — M7918 Myalgia, other site: Secondary | ICD-10-CM | POA: Diagnosis not present

## 2022-06-07 MED ORDER — METHOCARBAMOL 750 MG PO TABS
750.0000 mg | ORAL_TABLET | Freq: Three times a day (TID) | ORAL | 0 refills | Status: DC
  Filled 2022-06-07: qty 30, 10d supply, fill #0

## 2022-06-08 ENCOUNTER — Other Ambulatory Visit (HOSPITAL_COMMUNITY): Payer: Self-pay

## 2022-06-20 ENCOUNTER — Other Ambulatory Visit (HOSPITAL_COMMUNITY): Payer: Self-pay

## 2022-06-20 DIAGNOSIS — M25551 Pain in right hip: Secondary | ICD-10-CM | POA: Diagnosis not present

## 2022-06-20 DIAGNOSIS — M4310 Spondylolisthesis, site unspecified: Secondary | ICD-10-CM | POA: Diagnosis not present

## 2022-06-20 MED ORDER — CITALOPRAM HYDROBROMIDE 20 MG PO TABS
20.0000 mg | ORAL_TABLET | Freq: Every morning | ORAL | 3 refills | Status: AC
  Filled 2022-06-20: qty 90, 90d supply, fill #0
  Filled 2022-09-17: qty 90, 90d supply, fill #1
  Filled 2022-12-31: qty 90, 90d supply, fill #2
  Filled 2023-03-25 – 2023-03-27 (×2): qty 90, 90d supply, fill #3

## 2022-06-25 ENCOUNTER — Other Ambulatory Visit (HOSPITAL_COMMUNITY): Payer: Self-pay

## 2022-07-04 ENCOUNTER — Other Ambulatory Visit (HOSPITAL_COMMUNITY): Payer: Self-pay

## 2022-07-06 ENCOUNTER — Other Ambulatory Visit (HOSPITAL_COMMUNITY): Payer: Self-pay

## 2022-07-06 ENCOUNTER — Other Ambulatory Visit: Payer: Self-pay

## 2022-07-07 ENCOUNTER — Other Ambulatory Visit (HOSPITAL_COMMUNITY): Payer: Self-pay

## 2022-07-09 ENCOUNTER — Other Ambulatory Visit (HOSPITAL_COMMUNITY): Payer: Self-pay

## 2022-07-09 MED ORDER — MELOXICAM 15 MG PO TABS
15.0000 mg | ORAL_TABLET | Freq: Every day | ORAL | 1 refills | Status: DC
  Filled 2022-07-09: qty 30, 30d supply, fill #0
  Filled 2022-08-13: qty 30, 30d supply, fill #1

## 2022-07-18 ENCOUNTER — Other Ambulatory Visit (HOSPITAL_COMMUNITY): Payer: Self-pay

## 2022-07-18 DIAGNOSIS — M545 Low back pain, unspecified: Secondary | ICD-10-CM | POA: Diagnosis not present

## 2022-08-13 ENCOUNTER — Other Ambulatory Visit (HOSPITAL_COMMUNITY): Payer: Self-pay

## 2022-08-27 ENCOUNTER — Ambulatory Visit (HOSPITAL_BASED_OUTPATIENT_CLINIC_OR_DEPARTMENT_OTHER)
Admission: RE | Admit: 2022-08-27 | Discharge: 2022-08-27 | Disposition: A | Discharge status: Home / Self Care | Payer: 59 | Source: Ambulatory Visit | Attending: Pulmonary Disease | Admitting: Pulmonary Disease

## 2022-08-27 DIAGNOSIS — Z87891 Personal history of nicotine dependence: Secondary | ICD-10-CM | POA: Diagnosis not present

## 2022-08-27 DIAGNOSIS — R911 Solitary pulmonary nodule: Secondary | ICD-10-CM | POA: Insufficient documentation

## 2022-09-07 ENCOUNTER — Other Ambulatory Visit (HOSPITAL_COMMUNITY): Payer: Self-pay

## 2022-09-07 DIAGNOSIS — S80862A Insect bite (nonvenomous), left lower leg, initial encounter: Secondary | ICD-10-CM | POA: Diagnosis not present

## 2022-09-07 MED ORDER — BETAMETHASONE VALERATE 0.1 % EX CREA
TOPICAL_CREAM | CUTANEOUS | 0 refills | Status: DC
  Filled 2022-09-07: qty 15, 7d supply, fill #0

## 2022-09-12 DIAGNOSIS — R972 Elevated prostate specific antigen [PSA]: Secondary | ICD-10-CM | POA: Diagnosis not present

## 2022-09-17 ENCOUNTER — Other Ambulatory Visit (HOSPITAL_COMMUNITY): Payer: Self-pay

## 2022-09-17 MED ORDER — MELOXICAM 15 MG PO TABS
15.0000 mg | ORAL_TABLET | Freq: Every day | ORAL | 0 refills | Status: DC | PRN
  Filled 2022-09-17: qty 30, 30d supply, fill #0

## 2022-09-20 DIAGNOSIS — R972 Elevated prostate specific antigen [PSA]: Secondary | ICD-10-CM | POA: Diagnosis not present

## 2022-09-25 ENCOUNTER — Ambulatory Visit (HOSPITAL_BASED_OUTPATIENT_CLINIC_OR_DEPARTMENT_OTHER): Payer: 59 | Admitting: Pulmonary Disease

## 2022-09-27 ENCOUNTER — Other Ambulatory Visit: Payer: Self-pay | Admitting: Urology

## 2022-09-27 DIAGNOSIS — R972 Elevated prostate specific antigen [PSA]: Secondary | ICD-10-CM

## 2022-10-01 ENCOUNTER — Other Ambulatory Visit (HOSPITAL_COMMUNITY): Payer: Self-pay

## 2022-10-01 DIAGNOSIS — Z13220 Encounter for screening for lipoid disorders: Secondary | ICD-10-CM | POA: Diagnosis not present

## 2022-10-01 DIAGNOSIS — R030 Elevated blood-pressure reading, without diagnosis of hypertension: Secondary | ICD-10-CM | POA: Diagnosis not present

## 2022-10-01 DIAGNOSIS — Z0000 Encounter for general adult medical examination without abnormal findings: Secondary | ICD-10-CM | POA: Diagnosis not present

## 2022-10-01 DIAGNOSIS — M1990 Unspecified osteoarthritis, unspecified site: Secondary | ICD-10-CM | POA: Diagnosis not present

## 2022-10-01 DIAGNOSIS — F17200 Nicotine dependence, unspecified, uncomplicated: Secondary | ICD-10-CM | POA: Diagnosis not present

## 2022-10-01 DIAGNOSIS — J432 Centrilobular emphysema: Secondary | ICD-10-CM | POA: Diagnosis not present

## 2022-10-01 DIAGNOSIS — J438 Other emphysema: Secondary | ICD-10-CM | POA: Diagnosis not present

## 2022-10-01 DIAGNOSIS — R918 Other nonspecific abnormal finding of lung field: Secondary | ICD-10-CM | POA: Diagnosis not present

## 2022-10-01 DIAGNOSIS — N400 Enlarged prostate without lower urinary tract symptoms: Secondary | ICD-10-CM | POA: Diagnosis not present

## 2022-10-01 MED ORDER — NICOTINE 21-14-7 MG/24HR TD KIT
PACK | TRANSDERMAL | 0 refills | Status: AC
  Filled 2022-10-01: qty 56, 56d supply, fill #0

## 2022-10-01 MED ORDER — MELOXICAM 15 MG PO TABS
15.0000 mg | ORAL_TABLET | Freq: Every day | ORAL | 1 refills | Status: DC | PRN
  Filled 2022-11-02: qty 30, 30d supply, fill #0
  Filled 2022-12-24: qty 30, 30d supply, fill #1

## 2022-10-02 ENCOUNTER — Other Ambulatory Visit (HOSPITAL_COMMUNITY): Payer: Self-pay

## 2022-10-08 ENCOUNTER — Other Ambulatory Visit (HOSPITAL_COMMUNITY): Payer: Self-pay

## 2022-10-31 ENCOUNTER — Encounter: Payer: Self-pay | Admitting: Urology

## 2022-11-02 ENCOUNTER — Other Ambulatory Visit (HOSPITAL_COMMUNITY): Payer: Self-pay

## 2022-11-03 ENCOUNTER — Ambulatory Visit
Admission: RE | Admit: 2022-11-03 | Discharge: 2022-11-03 | Disposition: A | Discharge status: Home / Self Care | Payer: 59 | Source: Ambulatory Visit | Attending: Urology | Admitting: Urology

## 2022-11-03 DIAGNOSIS — R972 Elevated prostate specific antigen [PSA]: Secondary | ICD-10-CM | POA: Diagnosis not present

## 2022-11-03 MED ORDER — GADOPICLENOL 0.5 MMOL/ML IV SOLN
7.0000 mL | Freq: Once | INTRAVENOUS | Status: AC | PRN
  Administered 2022-11-03: 7 mL via INTRAVENOUS

## 2022-11-15 ENCOUNTER — Encounter: Payer: Self-pay | Admitting: Gastroenterology

## 2022-11-19 ENCOUNTER — Other Ambulatory Visit (HOSPITAL_COMMUNITY): Payer: Self-pay

## 2022-11-19 DIAGNOSIS — H01005 Unspecified blepharitis left lower eyelid: Secondary | ICD-10-CM | POA: Diagnosis not present

## 2022-11-19 DIAGNOSIS — H579 Unspecified disorder of eye and adnexa: Secondary | ICD-10-CM | POA: Diagnosis not present

## 2022-11-19 MED ORDER — OLOPATADINE HCL 0.2 % OP SOLN
OPHTHALMIC | 0 refills | Status: DC
  Filled 2022-11-19: qty 2.5, 25d supply, fill #0

## 2022-11-19 MED ORDER — ERYTHROMYCIN 5 MG/GM OP OINT
TOPICAL_OINTMENT | OPHTHALMIC | 0 refills | Status: DC
  Filled 2022-11-19: qty 3.5, 10d supply, fill #0

## 2022-11-21 ENCOUNTER — Other Ambulatory Visit (HOSPITAL_COMMUNITY): Payer: Self-pay

## 2022-11-21 MED ORDER — MAXITROL 3.5-10000-0.1 OP OINT
TOPICAL_OINTMENT | OPHTHALMIC | 2 refills | Status: DC
  Filled 2022-11-21: qty 3.5, 30d supply, fill #0

## 2022-11-26 ENCOUNTER — Other Ambulatory Visit (HOSPITAL_COMMUNITY): Payer: Self-pay

## 2022-11-26 DIAGNOSIS — L0201 Cutaneous abscess of face: Secondary | ICD-10-CM | POA: Diagnosis not present

## 2022-11-26 MED ORDER — AMOXICILLIN-POT CLAVULANATE 875-125 MG PO TABS
1.0000 | ORAL_TABLET | Freq: Two times a day (BID) | ORAL | 0 refills | Status: DC
  Filled 2022-11-26: qty 20, 10d supply, fill #0

## 2022-12-10 ENCOUNTER — Encounter: Payer: Self-pay | Admitting: Family Medicine

## 2022-12-12 DIAGNOSIS — L03113 Cellulitis of right upper limb: Secondary | ICD-10-CM | POA: Diagnosis not present

## 2022-12-25 ENCOUNTER — Other Ambulatory Visit (HOSPITAL_COMMUNITY): Payer: Self-pay

## 2023-01-02 ENCOUNTER — Other Ambulatory Visit (HOSPITAL_COMMUNITY): Payer: Self-pay

## 2023-01-31 ENCOUNTER — Other Ambulatory Visit (HOSPITAL_COMMUNITY): Payer: Self-pay

## 2023-02-01 ENCOUNTER — Other Ambulatory Visit (HOSPITAL_COMMUNITY): Payer: Self-pay

## 2023-02-01 MED ORDER — MELOXICAM 15 MG PO TABS
15.0000 mg | ORAL_TABLET | Freq: Every day | ORAL | 1 refills | Status: DC | PRN
  Filled 2023-02-01: qty 30, 30d supply, fill #0
  Filled 2023-03-25 – 2023-03-27 (×2): qty 30, 30d supply, fill #1

## 2023-02-15 ENCOUNTER — Encounter: Payer: Self-pay | Admitting: Gastroenterology

## 2023-03-18 DIAGNOSIS — R972 Elevated prostate specific antigen [PSA]: Secondary | ICD-10-CM | POA: Diagnosis not present

## 2023-03-18 DIAGNOSIS — N393 Stress incontinence (female) (male): Secondary | ICD-10-CM | POA: Diagnosis not present

## 2023-03-27 ENCOUNTER — Other Ambulatory Visit (HOSPITAL_COMMUNITY): Payer: Self-pay

## 2023-03-28 DIAGNOSIS — R1031 Right lower quadrant pain: Secondary | ICD-10-CM | POA: Diagnosis not present

## 2023-03-28 DIAGNOSIS — Z9889 Other specified postprocedural states: Secondary | ICD-10-CM | POA: Diagnosis not present

## 2023-03-28 DIAGNOSIS — Z8719 Personal history of other diseases of the digestive system: Secondary | ICD-10-CM | POA: Diagnosis not present

## 2023-04-08 ENCOUNTER — Encounter: Payer: Self-pay | Admitting: Gastroenterology

## 2023-04-08 ENCOUNTER — Ambulatory Visit (AMBULATORY_SURGERY_CENTER): Payer: 59

## 2023-04-08 VITALS — Ht 68.0 in | Wt 160.0 lb

## 2023-04-08 DIAGNOSIS — Z860100 Personal history of colon polyps, unspecified: Secondary | ICD-10-CM

## 2023-04-08 MED ORDER — SUTAB 1479-225-188 MG PO TABS
12.0000 | ORAL_TABLET | ORAL | 0 refills | Status: DC
Start: 2023-04-08 — End: 2023-06-03

## 2023-04-08 NOTE — Progress Notes (Signed)
No egg or soy allergy known to patient  No issues known to pt with past sedation with any surgeries or procedures Patient denies ever being told they had issues or difficulty with intubation  No FH of Malignant Hyperthermia Pt is not on diet pills Pt is not on  home 02  Pt is not on blood thinners  Pt denies issues with constipation  No A fib or A flutter Have any cardiac testing pending-- no  LOA: independent  Prep: sutab  Patient's chart reviewed by Keith Beard CNRA prior to previsit and patient appropriate for the [facility].  Previsit completed and red dot placed by patient's name on their procedure day (on provider's schedule).     PV completed with patient. Prep instructions sent via mychart and home address.

## 2023-04-18 ENCOUNTER — Other Ambulatory Visit (HOSPITAL_COMMUNITY): Payer: Self-pay

## 2023-04-22 ENCOUNTER — Encounter: Payer: 59 | Admitting: Gastroenterology

## 2023-05-13 ENCOUNTER — Ambulatory Visit (AMBULATORY_SURGERY_CENTER): Payer: Commercial Managed Care - PPO

## 2023-05-13 VITALS — Ht 68.0 in | Wt 160.0 lb

## 2023-05-13 DIAGNOSIS — Z860100 Personal history of colon polyps, unspecified: Secondary | ICD-10-CM

## 2023-05-13 NOTE — Progress Notes (Signed)
 No egg or soy allergy known to patient  No issues known to pt with past sedation with any surgeries or procedures Patient denies ever being told they had issues or difficulty with intubation  No FH of Malignant Hyperthermia Pt is not on diet pills Pt is not on  home 02  Pt is not on blood thinners  Pt denies issues with constipation  No A fib or A flutter Have any cardiac testing pending--no Ambulates independently

## 2023-05-27 ENCOUNTER — Other Ambulatory Visit (HOSPITAL_COMMUNITY): Payer: Self-pay

## 2023-05-27 ENCOUNTER — Encounter: Payer: Self-pay | Admitting: Gastroenterology

## 2023-05-28 ENCOUNTER — Other Ambulatory Visit (HOSPITAL_COMMUNITY): Payer: Self-pay

## 2023-05-28 MED ORDER — MELOXICAM 15 MG PO TABS
15.0000 mg | ORAL_TABLET | Freq: Every day | ORAL | 1 refills | Status: DC | PRN
  Filled 2023-05-28: qty 30, 30d supply, fill #0
  Filled 2023-07-11: qty 30, 30d supply, fill #1

## 2023-05-29 ENCOUNTER — Other Ambulatory Visit (HOSPITAL_COMMUNITY): Payer: Self-pay

## 2023-06-03 ENCOUNTER — Ambulatory Visit (AMBULATORY_SURGERY_CENTER): Payer: 59 | Admitting: Gastroenterology

## 2023-06-03 ENCOUNTER — Encounter: Payer: Self-pay | Admitting: Gastroenterology

## 2023-06-03 VITALS — BP 143/88 | HR 67 | Temp 98.2°F | Resp 13 | Ht 68.0 in | Wt 160.0 lb

## 2023-06-03 DIAGNOSIS — Z860101 Personal history of adenomatous and serrated colon polyps: Secondary | ICD-10-CM | POA: Diagnosis not present

## 2023-06-03 DIAGNOSIS — K648 Other hemorrhoids: Secondary | ICD-10-CM | POA: Diagnosis not present

## 2023-06-03 DIAGNOSIS — K5730 Diverticulosis of large intestine without perforation or abscess without bleeding: Secondary | ICD-10-CM

## 2023-06-03 DIAGNOSIS — Z1211 Encounter for screening for malignant neoplasm of colon: Secondary | ICD-10-CM

## 2023-06-03 DIAGNOSIS — F419 Anxiety disorder, unspecified: Secondary | ICD-10-CM | POA: Diagnosis not present

## 2023-06-03 DIAGNOSIS — Z860100 Personal history of colon polyps, unspecified: Secondary | ICD-10-CM

## 2023-06-03 DIAGNOSIS — K5520 Angiodysplasia of colon without hemorrhage: Secondary | ICD-10-CM | POA: Diagnosis not present

## 2023-06-03 MED ORDER — SODIUM CHLORIDE 0.9 % IV SOLN: 500.0000 mL | Freq: Once | INTRAVENOUS | Status: DC

## 2023-06-03 NOTE — Progress Notes (Signed)
 Pt's states no medical or surgical changes since previsit or office visit.

## 2023-06-03 NOTE — Op Note (Signed)
  [facility] Patient Name: Keith Beard Procedure Date: 06/03/2023 8:03 AM MRN: [id number] Endoscopist: Viviann Spare P. Adela Lank , MD, [id number] Age: 68 Referring MD:  Date of Birth: January 21, 1956 Gender: Male Account #: [id number] Procedure:                Colonoscopy Indications:              High risk colon cancer surveillance: Personal                            history of colonic polyps - one sessile serrated                            polyp removed 11/2017 Medicines:                Monitored Anesthesia Care Procedure:                Pre-Anesthesia Assessment:                           - Prior to the procedure, a History and Physical                            was performed, and patient medications and                            allergies were reviewed. The patient's tolerance of                            previous anesthesia was also reviewed. The risks                            and benefits of the procedure and the sedation                            options and risks were discussed with the patient.                            All questions were answered, and informed consent                            was obtained. Prior Anticoagulants: The patient has                            taken no anticoagulant or antiplatelet agents. ASA                            Grade Assessment: I - A normal, healthy patient.                            After reviewing the risks and benefits, the patient                            was deemed in satisfactory condition to undergo the  procedure.                           After obtaining informed consent, the colonoscope                            was passed under direct vision. Throughout the                            procedure, the patient's blood pressure, pulse, and                            oxygen saturations were monitored continuously. The                            Olympus Scope SN [id number] was introduced  through the                            anus and advanced to the the cecum, identified by                            appendiceal orifice and ileocecal valve. The                            colonoscopy was performed without difficulty. The                            patient tolerated the procedure well. The quality                            of the bowel preparation was adequate. The                            ileocecal valve, appendiceal orifice, and rectum                            were photographed. Scope In: 8:13:43 AM Scope Out: 8:28:53 AM Scope Withdrawal Time: 0 hours 11 minutes 32 seconds  Total Procedure Duration: 0 hours 15 minutes 10 seconds  Findings:                 The perianal and digital rectal examinations were                            normal.                           A single angiodysplastic lesion was found in the                            cecum.                           Many diverticula were found in the left colon.  Internal hemorrhoids were found during retroflexion.                           The exam was otherwise without abnormality. Complications:            No immediate complications. Estimated blood loss:                            None. Estimated Blood Loss:     Estimated blood loss: none. Impression:               - A single colonic angiodysplastic lesion.                           - Diverticulosis in the left colon.                           - Internal hemorrhoids.                           - The examination was otherwise normal.                           - No polyps. Recommendation:           - Patient has a contact number available for                            emergencies. The signs and symptoms of potential                            delayed complications were discussed with the                            patient. Return to normal activities tomorrow.                            Written discharge instructions were provided  to the                            patient.                           - Resume previous diet.                           - Continue present medications.                           - Repeat colonoscopy in 10 years for surveillance. Viviann Spare P. Llana Deshazo, MD 06/03/2023 8:35:34 AM This report has been signed electronically.

## 2023-06-03 NOTE — Progress Notes (Signed)
 Pt resting comfortably. VSS. Airway intact. SBAR complete to RN. All questions answered.

## 2023-06-03 NOTE — Progress Notes (Signed)
 [facility] History and Physical   Primary Care Physician:  Carilyn Goodpasture, NP   Reason for Procedure:   History of colon polyps  Plan:    colonoscopy     HPI: Keith Beard is a 68 y.o. male  here for colonoscopy surveillance - last exam 11/2017 - [facility].   Patient denies any bowel symptoms at this time. No family history of colon cancer known. Otherwise feels well without any cardiopulmonary symptoms.   I have discussed risks / benefits of anesthesia and endoscopic procedure with Fabian Sharp and they wish to proceed with the exams as outlined today.    Past Medical History:  Diagnosis Date   Anxiety    Asthma    childhood   BPH (benign prostatic hyperplasia)     Past Surgical History:  Procedure Laterality Date   COLONOSCOPY     CYSTOSCOPY WITH FULGERATION N/A 09/04/2019   Procedure: CYSTOSCOPY WITH FULGERATION;  Surgeon: Sondra Come, MD;  Location: [facility] ORS;  Service: Urology;  Laterality: N/A;   HOLEP-LASER ENUCLEATION OF THE PROSTATE WITH MORCELLATION N/A 09/04/2019   Procedure: HOLEP-LASER ENUCLEATION OF THE PROSTATE WITH MORCELLATION;  Surgeon: Sondra Come, MD;  Location: [facility] ORS;  Service: Urology;  Laterality: N/A;   right inguinal hernis repair Right    TONSILLECTOMY     WISDOM TOOTH EXTRACTION     20 years ago    Prior to Admission medications   Medication Sig Start Date End Date Taking? Authorizing Provider  meloxicam (MOBIC) 15 MG tablet Take 1 tablet (15 mg total) by mouth daily as needed. 05/28/23  Yes   citalopram (CELEXA) 20 MG tablet Take 1 tablet (20 mg total) by mouth in the morning. 06/20/22     Multiple Vitamin (MULTIVITAMIN) tablet Take 1 tablet by mouth daily.    [provider]  Nicotine 21-14-7 MG/24HR KIT Place 21 mg patch daily onto the skin for 28 days. then place 14 mg patch daily onto the skin for 14 days then place 7 mg patch daily onto the skin for 14 days for a total of 56 days. Patient not taking:  Reported on 06/03/2023 10/01/22     tadalafil (CIALIS) 10 MG tablet Take 10 mg by mouth daily as needed for erectile dysfunction. Patient not taking: Reported on 04/08/2023    [provider]  vitamin B-12 (CYANOCOBALAMIN) 100 MCG tablet Take 100 mcg by mouth daily.    [provider]  vitamin C (ASCORBIC ACID) 500 MG tablet Take 500 mg by mouth daily.    [provider]  vitamin E 400 UNIT capsule Take 400 Units by mouth daily.    [provider]    Current Outpatient Medications  Medication Sig Dispense Refill   meloxicam (MOBIC) 15 MG tablet Take 1 tablet (15 mg total) by mouth daily as needed. 30 tablet 1   citalopram (CELEXA) 20 MG tablet Take 1 tablet (20 mg total) by mouth in the morning. 90 tablet 3   Multiple Vitamin (MULTIVITAMIN) tablet Take 1 tablet by mouth daily.     Nicotine 21-14-7 MG/24HR KIT Place 21 mg patch daily onto the skin for 28 days. then place 14 mg patch daily onto the skin for 14 days then place 7 mg patch daily onto the skin for 14 days for a total of 56 days. (Patient not taking: Reported on 06/03/2023) 56 each 0   tadalafil (CIALIS) 10 MG tablet Take 10 mg by mouth daily as needed for erectile dysfunction. (Patient  not taking: Reported on 04/08/2023)     vitamin B-12 (CYANOCOBALAMIN) 100 MCG tablet Take 100 mcg by mouth daily.     vitamin C (ASCORBIC ACID) 500 MG tablet Take 500 mg by mouth daily.     vitamin E 400 UNIT capsule Take 400 Units by mouth daily.     Current Facility-Administered Medications  Medication Dose Route Frequency Provider Last Rate Last Admin   0.9 %  sodium chloride infusion  500 mL Intravenous Once Jurgen Groeneveld, Willaim Rayas, MD        Allergies as of 06/03/2023   (No Known Allergies)    Family History  Problem Relation Age of Onset   Hypertension Father    Colon cancer Neg Hx    Colon polyps Neg Hx    Esophageal cancer Neg Hx    Stomach cancer Neg Hx    Rectal cancer Neg Hx    Prostate cancer Neg Hx     Kidney cancer Neg Hx    Bladder Cancer Neg Hx     Social History   Socioeconomic History   Marital status: Unknown    Spouse name: Not on file   Number of children: Not on file   Years of education: Not on file   Highest education level: Not on file  Occupational History   Not on file  Tobacco Use   Smoking status: Every Day    Current packs/day: 1.00    Types: Cigarettes   Smokeless tobacco: Never   Tobacco comments:    Started smoking age 23 about a pack   Vaping Use   Vaping status: Never Used  Substance and Sexual Activity   Alcohol use: Yes    Alcohol/week: 4.0 - 5.0 standard drinks of alcohol    Types: 4 - 5 Cans of beer per week   Drug use: Never   Sexual activity: Yes  Other Topics Concern   Not on file  Social History Narrative   ** Merged History Encounter **       Social Drivers of Corporate investment banker Strain: Not on file  Food Insecurity: Not on file  Transportation Needs: Not on file  Physical Activity: Not on file  Stress: Not on file  Social Connections: Not on file  Intimate Partner Violence: Not on file    Review of Systems: All other review of systems negative except as mentioned in the HPI.  Physical Exam: Vital signs BP (!) 146/97   Pulse 74   Temp 98.2 F (36.8 C)   Ht 5\' 8"  (1.727 m)   Wt 160 lb (72.6 kg)   SpO2 94%   BMI 24.33 kg/m   General:   Alert,  Well-developed, pleasant and cooperative in NAD Lungs:  Clear throughout to auscultation.   Heart:  Regular rate and rhythm Abdomen:  Soft, nontender and nondistended.   Neuro/Psych:  Alert and cooperative. Normal mood and affect. A and O x 3  Harlin Rain, MD [facility]

## 2023-06-03 NOTE — Patient Instructions (Signed)
 Resume previous diet and medications.  Handouts provided on hemorrhoids and diverticulosis.  Follow up colonoscopy in 10 years.    YOU HAD AN ENDOSCOPIC PROCEDURE TODAY AT THE [facility]:   Refer to the procedure report that was given to you for any specific questions about what was found during the examination.  If the procedure report does not answer your questions, please call your gastroenterologist to clarify.  If you requested that your care partner not be given the details of your procedure findings, then the procedure report has been included in a sealed envelope for you to review at your convenience later.  YOU SHOULD EXPECT: Some feelings of bloating in the abdomen. Passage of more gas than usual.  Walking can help get rid of the air that was put into your GI tract during the procedure and reduce the bloating. If you had a lower endoscopy (such as a colonoscopy or flexible sigmoidoscopy) you may notice spotting of blood in your stool or on the toilet paper. If you underwent a bowel prep for your procedure, you may not have a normal bowel movement for a few days.  Please Note:  You might notice some irritation and congestion in your nose or some drainage.  This is from the oxygen used during your procedure.  There is no need for concern and it should clear up in a day or so.  SYMPTOMS TO REPORT IMMEDIATELY:  Following lower endoscopy (colonoscopy or flexible sigmoidoscopy):  Excessive amounts of blood in the stool  Significant tenderness or worsening of abdominal pains  Swelling of the abdomen that is new, acute  Fever of 100F or higher  For urgent or emergent issues, a gastroenterologist can be reached at any hour by calling [phone]. Do not use MyChart messaging for urgent concerns.    DIET:  We do recommend a small meal at first, but then you may proceed to your regular diet.  Drink plenty of fluids but you should avoid alcoholic beverages for 24  hours.  ACTIVITY:  You should plan to take it easy for the rest of today and you should NOT DRIVE or use heavy machinery until tomorrow (because of the sedation medicines used during the test).    FOLLOW UP: Our staff will call the number listed on your records the next business day following your procedure.  We will call around 7:15- 8:00 am to check on you and address any questions or concerns that you may have regarding the information given to you following your procedure. If we do not reach you, we will leave a message.     If any biopsies were taken you will be contacted by phone or by letter within the next 1-3 weeks.  Please call us at [phone] if you have not heard about the biopsies in 3 weeks.    SIGNATURES/CONFIDENTIALITY: You and/or your care partner have signed paperwork which will be entered into your electronic medical record.  These signatures attest to the fact that that the information above on your After Visit Summary has been reviewed and is understood.  Full responsibility of the confidentiality of this discharge information lies with you and/or your care-partner.

## 2023-06-04 ENCOUNTER — Telehealth: Payer: Self-pay

## 2023-06-04 NOTE — Telephone Encounter (Signed)
  Follow up Call-     06/03/2023    7:29 AM  Call back number  Post procedure Call Back phone  # [phone]  Permission to leave phone message Yes     Patient questions:  Do you have a fever, pain , or abdominal swelling? No. Pain Score  0 *  Have you tolerated food without any problems? Yes.    Have you been able to return to your normal activities? Yes.    Do you have any questions about your discharge instructions: Diet   No. Medications  No. Follow up visit  No.  Do you have questions or concerns about your Care? No.  Actions: * If pain score is 4 or above: No action needed, pain <4.

## 2023-07-09 ENCOUNTER — Other Ambulatory Visit (HOSPITAL_COMMUNITY): Payer: Self-pay

## 2023-07-09 MED ORDER — CITALOPRAM HYDROBROMIDE 20 MG PO TABS
20.0000 mg | ORAL_TABLET | Freq: Every morning | ORAL | 3 refills | Status: AC
  Filled 2023-07-09: qty 90, 90d supply, fill #0
  Filled 2023-10-15: qty 90, 90d supply, fill #1
  Filled 2024-01-15: qty 90, 90d supply, fill #2
  Filled 2024-04-13: qty 90, 90d supply, fill #3

## 2023-08-21 ENCOUNTER — Other Ambulatory Visit (HOSPITAL_COMMUNITY): Payer: Self-pay

## 2023-08-21 ENCOUNTER — Ambulatory Visit (HOSPITAL_BASED_OUTPATIENT_CLINIC_OR_DEPARTMENT_OTHER): Admitting: Nurse Practitioner

## 2023-08-21 ENCOUNTER — Encounter (HOSPITAL_BASED_OUTPATIENT_CLINIC_OR_DEPARTMENT_OTHER): Payer: Self-pay | Admitting: Nurse Practitioner

## 2023-08-21 VITALS — BP 155/88 | HR 69 | Ht 66.0 in | Wt 160.0 lb

## 2023-08-21 DIAGNOSIS — J432 Centrilobular emphysema: Secondary | ICD-10-CM | POA: Diagnosis not present

## 2023-08-21 DIAGNOSIS — J302 Other seasonal allergic rhinitis: Secondary | ICD-10-CM | POA: Diagnosis not present

## 2023-08-21 DIAGNOSIS — R918 Other nonspecific abnormal finding of lung field: Secondary | ICD-10-CM

## 2023-08-21 DIAGNOSIS — F17210 Nicotine dependence, cigarettes, uncomplicated: Secondary | ICD-10-CM | POA: Diagnosis not present

## 2023-08-21 DIAGNOSIS — Z720 Tobacco use: Secondary | ICD-10-CM

## 2023-08-21 MED ORDER — ALBUTEROL SULFATE HFA 108 (90 BASE) MCG/ACT IN AERS
2.0000 | INHALATION_SPRAY | Freq: Four times a day (QID) | RESPIRATORY_TRACT | 2 refills | Status: AC | PRN
  Filled 2023-08-21 (×2): qty 6.7, 30d supply, fill #0

## 2023-08-21 NOTE — Patient Instructions (Signed)
 Albuterol inhaler 2 puffs every 6 hours as needed for shortness of breath or wheezing. Notify if symptoms persist despite rescue inhaler/neb use.  Continue loratadine for allergies Fluticasone nasal spray 2 sprays each nostril daily as needed for postnasal drip/allergies   Lung function testing at follow up  Referral to lung cancer screening program - call us  if you haven't heard something in the next 2-3 weeks about scheduling this  Work on quitting smoking and let us  know if you need any assistance   Follow up in 6 months after PFT with Dr. Washington Hacker. If symptoms worsen, please contact office for sooner follow up or seek emergency care.

## 2023-08-21 NOTE — Progress Notes (Signed)
 @Patient  ID: Keith Beard, male    DOB: Jul 05, 1955, 68 y.o.   MRN: [id number]  Chief Complaint  Patient presents with   Follow-up    Pulmonary nodule    Referring provider: Chyrel Craw, NP  HPI: 68 year old male, active smoker followed for lung nodules and emphysema. He is a patient of Dr. Jacqualyn Mates and last seen in office 05/31/2022. Past medical history significant for anxiety, BPH.  TEST/EVENTS:  05/09/2022 [facility] CT chest: Mild atherosclerosis.  Emphysema.  Scattered bilateral pulmonary nodules, up to 9.3 mm in left lower lobe.  Subsegmental atelectasis/scarring in lung bases.  Tiny hypodensities in right liver, likely benign cysts.  3.2 cm low-density lesion in the caudate lobe (evaluated on MRI abd) 08/27/2022 [facility] CT chest: Mild atherosclerosis.  Emphysema.  Multiple bilateral pulmonary nodules, stable in interval.  Left lower lobe 9.3 mm nodule is actually smaller at 7.6 mm.  Lung RADS 2.  Benign hepatic cysts.  05/31/2023: OV with Dr. Washington Hacker. Referred by PCP for b/l pulmonary nodules and tobacco use. Largest 9 mm. Denies respiratory symptoms. Did have a cough associated with respiratory illness but this has resolved. Recommended surveillance imaging; CT 3 months. Smoking cessation; rx nicotine  patches.   08/21/2023: Today - follow up Discussed the use of AI scribe software for clinical note transcription with the patient, who gave verbal consent to proceed.  History of Present Illness   Keith Beard is a 68 year old male who presents for follow-up.  He had experienced breathing difficulties during the pollen season, characterized by a 'heavy chested' feeling and a persistent need to clear his throat. This is accompanied by frequent coughing with minimal production. These symptoms have resolved over the last month.  Wants to know what he could do in the future if this recurs.  Did not seek any further evaluation at this time.  Does have a history of allergy type symptoms during  allergy season.  No prednisone  or antibiotics.  No hospitalizations.  No issues with his breathing now or at baseline.  He has a history of smoking and currently smokes about a pack a day.  Occasional chest congestion but nothing bothersome.  Restarted loratadine about a month ago, which coincides with improvement in symptoms.  He has not been using any inhalers or other medications regularly for his breathing issues.  Never been on scheduled bronchodilators in the past.  He works outside frequently and walks his dog daily.  No limitations in activities.  He also reports frequent throat clearing.  No fevers, chills, hemoptysis.     No Known Allergies  Immunization History  Administered Date(s) Administered   Influenza,inj,Quad PF,6+ Mos 12/20/2018   PFIZER(Purple Top)SARS-COV-2 Vaccination 05/22/2019, 06/17/2019    Past Medical History:  Diagnosis Date   Anxiety    Asthma    childhood   BPH (benign prostatic hyperplasia)     Tobacco History: Social History   Tobacco Use  Smoking Status Every Day   Current packs/day: 1.00   Types: Cigarettes  Smokeless Tobacco Never  Tobacco Comments   Started smoking age 19 about a pack    Ready to quit: Not Answered Counseling given: Not Answered Tobacco comments: Started smoking age 23 about a pack    Outpatient Medications Prior to Visit  Medication Sig Dispense Refill   citalopram  (CELEXA ) 20 MG tablet Take 1 tablet (20 mg total) by mouth in the morning. 90 tablet 3   citalopram  (CELEXA ) 20 MG tablet Take 1 tablet (20 mg total)  by mouth in the morning. 90 tablet 3   meloxicam  (MOBIC ) 15 MG tablet Take 1 tablet (15 mg total) by mouth daily as needed. 30 tablet 1   Multiple Vitamin (MULTIVITAMIN) tablet Take 1 tablet by mouth daily.     Nicotine  21-14-7 MG/24HR KIT Place 21 mg patch daily onto the skin for 28 days. then place 14 mg patch daily onto the skin for 14 days then place 7 mg patch daily onto the skin for 14 days for a total  of 56 days. 56 each 0   tadalafil (CIALIS) 10 MG tablet Take 10 mg by mouth daily as needed for erectile dysfunction.     vitamin B-12 (CYANOCOBALAMIN ) 100 MCG tablet Take 100 mcg by mouth daily.     vitamin C (ASCORBIC ACID) 500 MG tablet Take 500 mg by mouth daily.     vitamin E 400 UNIT capsule Take 400 Units by mouth daily.     No facility-administered medications prior to visit.     Review of Systems:   Constitutional: No weight loss or gain, night sweats, fevers, chills, fatigue, or lassitude. HEENT: No headaches, difficulty swallowing, tooth/dental problems, or sore throat. No sneezing, itching, ear ache, nasal congestion, + post nasal drip, throat clearing CV:  No chest pain, orthopnea, PND, swelling in lower extremities, anasarca, dizziness, palpitations, syncope Resp: +occasional minimal cough. No shortness of breath with exertion or at rest. No excess mucus or change in color of mucus. No hemoptysis. No wheezing.  No chest wall deformity GI:  No heartburn, indigestion Skin: No rash, lesions, ulcerations MSK:  No joint pain or swelling.   Neuro: No dizziness or lightheadedness.  Psych: No depression or anxiety. Mood stable.     Physical Exam:  BP (!) 155/88   Pulse 69   Ht 5' 6 (1.676 m)   Wt 160 lb (72.6 kg)   SpO2 96%   BMI 25.82 kg/m   GEN: Pleasant, interactive, well-appearing; in no acute distress HEENT:  Normocephalic and atraumatic. PERRLA. Sclera white. Nasal turbinates erythematous, moist and patent bilaterally. No rhinorrhea present. Oropharynx pink and moist, without exudate or edema. No lesions, ulcerations, or postnasal drip.  NECK:  Supple w/ fair ROM. Thyroid symmetrical with no goiter or nodules palpated. No lymphadenopathy.   CV: RRR, no m/r/g, no peripheral edema. Pulses intact, +2 bilaterally. No cyanosis, pallor or clubbing. PULMONARY:  Unlabored, regular breathing. Clear bilaterally A&P w/o wheezes/rales/rhonchi. No accessory muscle use.  GI: BS  present and normoactive. Soft, non-tender to palpation. No organomegaly or masses detected.  MSK: No erythema, warmth or tenderness. Cap refil <2 sec all extrem. No deformities or joint swelling noted.  Neuro: A/Ox3. No focal deficits noted.   Skin: Warm, no lesions or rashe Psych: Normal affect and behavior. Judgement and thought content appropriate.     Lab Results:  CBC    Component Value Date/Time   WBC 8.7 11/22/2017 0802   RBC 4.44 11/22/2017 0802   HGB 14.1 11/22/2017 0802   HCT 41.5 11/22/2017 0802   PLT 263 11/22/2017 0802   MCV 93.5 11/22/2017 0802   MCH 31.8 11/22/2017 0802   MCHC 34.0 11/22/2017 0802   RDW 13.6 11/22/2017 0802   LYMPHSABS 1.7 11/22/2017 0802   MONOABS 0.5 11/22/2017 0802   EOSABS 0.1 11/22/2017 0802   BASOSABS 0.0 11/22/2017 0802    BMET    Component Value Date/Time   NA 141 11/22/2017 0802   K 4.5 11/22/2017 0802   CL 110 11/22/2017  0802   CO2 24 11/22/2017 0802   GLUCOSE 105 (H) 11/22/2017 0802   BUN 23 (H) 11/22/2017 0802   CREATININE 0.84 11/22/2017 0802   CALCIUM 9.2 11/22/2017 0802   GFRNONAA >60 11/22/2017 0802   GFRAA >60 11/22/2017 0802    BNP No results found for: BNP   Imaging:  No results found.  Administration History     None           No data to display          No results found for: NITRICOXIDE      Assessment & Plan:   Centrilobular emphysema (HCC) Emphysematous changes on imaging.  No prior lung function testing.  At baseline, he is asymptomatic.  Difficulties during allergy season.  Resolved with addition of nondrowsy antihistamine.  Stable today.  Will provide him with albuterol rescue inhaler.  Side effect profile reviewed.  Understands that if he is using this on a regular basis or has further issues with his breathing that become more of a consistent problem, can consider scheduled bronchodilator therapy.  Recommend PFT at next follow-up.  Smoking cessation strongly advised.  Continue  trigger prevention.  Patient Instructions  Albuterol inhaler 2 puffs every 6 hours as needed for shortness of breath or wheezing. Notify if symptoms persist despite rescue inhaler/neb use.  Continue loratadine for allergies Fluticasone nasal spray 2 sprays each nostril daily as needed for postnasal drip/allergies   Lung function testing at follow up  Referral to lung cancer screening program - call us  if you haven't heard something in the next 2-3 weeks about scheduling this  Work on quitting smoking and let us  know if you need any assistance   Follow up in 6 months after PFT with Dr. Washington Hacker. If symptoms worsen, please contact office for sooner follow up or seek emergency care.    Allergic rhinitis Seasonal component.  Improved with addition of loratadine.  Advised to continue this during allergy seasons.  Still has some frequent throat clearing.  Will add on Flonase to target postnasal drainage.  Side effect profile reviewed.  Lung nodules Stable bilateral pulmonary nodules, largest of which is actually decreased in size when compared to prior.  Lung RADS 2.  Will refer him to the lung cancer screening program for annual monitoring.  Cigarette smoker The patient's current tobacco use: 1 ppd The patient was advised to quit and impact of smoking on their health.  I assessed the patient's willingness to attempt to quit. I provided methods and skills for cessation. We reviewed medication management of smoking session drugs if appropriate. The amount of time spent counseling patient was 3 mins     Advised if symptoms do not improve or worsen, to please contact office for sooner follow up or seek emergency care.   I spent 35 minutes of dedicated to the care of this patient on the date of this encounter to include pre-visit review of records, face-to-face time with the patient discussing conditions above, post visit ordering of testing, clinical documentation with the electronic health  record, making appropriate referrals as documented, and communicating necessary findings to members of the patients care team.  Roetta Clarke, NP 08/22/2023  Pt aware and understands NP's role.

## 2023-08-22 ENCOUNTER — Encounter (HOSPITAL_BASED_OUTPATIENT_CLINIC_OR_DEPARTMENT_OTHER): Payer: Self-pay | Admitting: Nurse Practitioner

## 2023-08-22 DIAGNOSIS — J309 Allergic rhinitis, unspecified: Secondary | ICD-10-CM | POA: Insufficient documentation

## 2023-08-22 DIAGNOSIS — F17210 Nicotine dependence, cigarettes, uncomplicated: Secondary | ICD-10-CM | POA: Insufficient documentation

## 2023-08-22 DIAGNOSIS — R918 Other nonspecific abnormal finding of lung field: Secondary | ICD-10-CM | POA: Insufficient documentation

## 2023-08-22 DIAGNOSIS — J432 Centrilobular emphysema: Secondary | ICD-10-CM | POA: Insufficient documentation

## 2023-08-22 NOTE — Assessment & Plan Note (Signed)
 Emphysematous changes on imaging.  No prior lung function testing.  At baseline, he is asymptomatic.  Difficulties during allergy season.  Resolved with addition of nondrowsy antihistamine.  Stable today.  Will provide him with albuterol rescue inhaler.  Side effect profile reviewed.  Understands that if he is using this on a regular basis or has further issues with his breathing that become more of a consistent problem, can consider scheduled bronchodilator therapy.  Recommend PFT at next follow-up.  Smoking cessation strongly advised.  Continue trigger prevention.  Patient Instructions  Albuterol inhaler 2 puffs every 6 hours as needed for shortness of breath or wheezing. Notify if symptoms persist despite rescue inhaler/neb use.  Continue loratadine for allergies Fluticasone nasal spray 2 sprays each nostril daily as needed for postnasal drip/allergies   Lung function testing at follow up  Referral to lung cancer screening program - call us  if you haven't heard something in the next 2-3 weeks about scheduling this  Work on quitting smoking and let us  know if you need any assistance   Follow up in 6 months after PFT with Dr. Washington Hacker. If symptoms worsen, please contact office for sooner follow up or seek emergency care.

## 2023-08-22 NOTE — Assessment & Plan Note (Signed)
 The patient's current tobacco use: 1 ppd The patient was advised to quit and impact of smoking on their health.  I assessed the patient's willingness to attempt to quit. I provided methods and skills for cessation. We reviewed medication management of smoking session drugs if appropriate. The amount of time spent counseling patient was 3 mins

## 2023-08-22 NOTE — Assessment & Plan Note (Signed)
 Seasonal component.  Improved with addition of loratadine.  Advised to continue this during allergy seasons.  Still has some frequent throat clearing.  Will add on Flonase to target postnasal drainage.  Side effect profile reviewed.

## 2023-08-22 NOTE — Assessment & Plan Note (Signed)
 Stable bilateral pulmonary nodules, largest of which is actually decreased in size when compared to prior.  Lung RADS 2.  Will refer him to the lung cancer screening program for annual monitoring.

## 2023-08-26 ENCOUNTER — Other Ambulatory Visit: Payer: Self-pay | Admitting: *Deleted

## 2023-08-26 ENCOUNTER — Telehealth: Payer: Self-pay | Admitting: *Deleted

## 2023-08-26 DIAGNOSIS — Z87891 Personal history of nicotine dependence: Secondary | ICD-10-CM

## 2023-08-26 DIAGNOSIS — F17210 Nicotine dependence, cigarettes, uncomplicated: Secondary | ICD-10-CM

## 2023-08-26 DIAGNOSIS — Z122 Encounter for screening for malignant neoplasm of respiratory organs: Secondary | ICD-10-CM

## 2023-08-26 NOTE — Telephone Encounter (Signed)
 Lung Cancer Screening Narrative/Criteria Questionnaire (Cigarette Smokers Only- No Cigars/Pipes/vapes)   Keith Beard   SDMV:09/03/23 2:30- Kristen                                           1955/05/24              LDCT: 09/03/23 5:00- DWB    68 y.o.   Phone: [phone]  Lung Screening Narrative (confirm age 45-77 yrs Medicare / 50-80 yrs Private pay insurance)   Insurance information: [employer]   Referring Provider:[facility]   This screening involves an initial phone call with a team member from our program. It is called a shared decision making visit. The initial meeting is required by insurance and Medicare to make sure you understand the program. This appointment takes about 15-20 minutes to complete. The CT scan will completed at a separate date/time. This scan takes about 5-10 minutes to complete and you may eat and drink before and after the scan.  Criteria questions for Lung Cancer Screening:   Are you a current or former smoker? Current Age began smoking: 35   If you are a former smoker, what year did you quit smoking(within 15 yrs)   To calculate your smoking history, I need an accurate estimate of how many packs of cigarettes you smoked per day and for how many years. (Not just the number of PPD you are now smoking)   Years smoking 51 x Packs per day 1 = Pack years 51   (at least 20 pack yrs)   (Make sure they understand that we need to know how much they have smoked in the past, not just the number of PPD they are smoking now)  Do you have a personal history of cancer?  No    Do you have a family history of cancer? No  Are you coughing up blood?  No  Have you had unexplained weight loss of 15 lbs or more in the last 6 months? No  It looks like you meet all criteria.     Additional information: N/A

## 2023-09-03 ENCOUNTER — Ambulatory Visit: Admitting: *Deleted

## 2023-09-03 ENCOUNTER — Encounter: Payer: Self-pay | Admitting: *Deleted

## 2023-09-03 ENCOUNTER — Ambulatory Visit (HOSPITAL_BASED_OUTPATIENT_CLINIC_OR_DEPARTMENT_OTHER)
Admission: RE | Admit: 2023-09-03 | Discharge: 2023-09-03 | Disposition: A | Discharge status: Home / Self Care | Source: Ambulatory Visit | Attending: Acute Care | Admitting: Acute Care

## 2023-09-03 DIAGNOSIS — F17210 Nicotine dependence, cigarettes, uncomplicated: Secondary | ICD-10-CM | POA: Diagnosis not present

## 2023-09-03 DIAGNOSIS — Z122 Encounter for screening for malignant neoplasm of respiratory organs: Secondary | ICD-10-CM | POA: Insufficient documentation

## 2023-09-03 DIAGNOSIS — Z87891 Personal history of nicotine dependence: Secondary | ICD-10-CM | POA: Insufficient documentation

## 2023-09-03 NOTE — Progress Notes (Signed)
 Virtual Visit via Video Note  I connected with Keith Beard on 09/03/23 at  2:30 PM EDT by a video enabled telemedicine application and verified that I am speaking with the correct person using two identifiers.  Location: Patient: in home Provider: [address], [place], [place], [address]    Shared Decision Making Visit Lung Cancer Screening Program [phone])   Eligibility: Age 68 y.o. Pack Years Smoking History Calculation 51 (# packs/per year x # years smoked) Recent History of coughing up blood  no Unexplained weight loss? no ( >Than 15 pounds within the last 6 months ) Prior History Lung / other cancer no (Diagnosis within the last 5 years already requiring surveillance chest CT Scans). Smoking Status Current Smoker Former Smokers: Years since quit:  NA  Quit Date: NA  Visit Components: Discussion included one or more decision making aids. yes Discussion included risk/benefits of screening. yes Discussion included potential follow up diagnostic testing for abnormal scans. yes Discussion included meaning and risk of over diagnosis. yes Discussion included meaning and risk of False Positives. yes Discussion included meaning of total radiation exposure. yes  Counseling Included: Importance of adherence to annual lung cancer LDCT screening. yes Impact of comorbidities on ability to participate in the program. yes Ability and willingness to under diagnostic treatment. yes  Smoking Cessation Counseling: Current Smokers:  Discussed importance of smoking cessation. yes Information about tobacco cessation classes and interventions provided to patient. yes Patient provided with ticket for LDCT Scan. yes Symptomatic Patient. no  Counseling NA Diagnosis Code: Tobacco Use Z72.0 Asymptomatic Patient yes  Counseling (Intermediate counseling: > three minutes counseling) [id number] Former Smokers:  Discussed the importance of maintaining cigarette abstinence.  yes Diagnosis Code: Personal History of Nicotine  Dependence. S12.108 Information about tobacco cessation classes and interventions provided to patient. Yes Patient provided with ticket for LDCT Scan. yes Written Order for Lung Cancer Screening with LDCT placed in Epic. Yes (CT Chest Lung Cancer Screening Low Dose W/O CM) [id number] Z12.2-Screening of respiratory organs Z87.891-Personal history of nicotine  dependence   Josette Ranger, RN 09/03/23

## 2023-09-03 NOTE — Patient Instructions (Signed)

## 2023-09-11 ENCOUNTER — Other Ambulatory Visit: Payer: Self-pay | Admitting: Acute Care

## 2023-09-11 DIAGNOSIS — Z87891 Personal history of nicotine dependence: Secondary | ICD-10-CM

## 2023-09-11 DIAGNOSIS — F17210 Nicotine dependence, cigarettes, uncomplicated: Secondary | ICD-10-CM

## 2023-09-11 DIAGNOSIS — Z122 Encounter for screening for malignant neoplasm of respiratory organs: Secondary | ICD-10-CM

## 2023-10-03 DIAGNOSIS — J432 Centrilobular emphysema: Secondary | ICD-10-CM | POA: Diagnosis not present

## 2023-10-03 DIAGNOSIS — M1990 Unspecified osteoarthritis, unspecified site: Secondary | ICD-10-CM | POA: Diagnosis not present

## 2023-10-03 DIAGNOSIS — Z0000 Encounter for general adult medical examination without abnormal findings: Secondary | ICD-10-CM | POA: Diagnosis not present

## 2023-10-03 DIAGNOSIS — R030 Elevated blood-pressure reading, without diagnosis of hypertension: Secondary | ICD-10-CM | POA: Diagnosis not present

## 2023-10-03 DIAGNOSIS — R918 Other nonspecific abnormal finding of lung field: Secondary | ICD-10-CM | POA: Diagnosis not present

## 2023-10-03 DIAGNOSIS — I700 Atherosclerosis of aorta: Secondary | ICD-10-CM | POA: Diagnosis not present

## 2023-10-03 DIAGNOSIS — N529 Male erectile dysfunction, unspecified: Secondary | ICD-10-CM | POA: Diagnosis not present

## 2023-10-03 DIAGNOSIS — F17200 Nicotine dependence, unspecified, uncomplicated: Secondary | ICD-10-CM | POA: Diagnosis not present

## 2023-10-03 DIAGNOSIS — N400 Enlarged prostate without lower urinary tract symptoms: Secondary | ICD-10-CM | POA: Diagnosis not present

## 2023-10-08 ENCOUNTER — Other Ambulatory Visit (HOSPITAL_COMMUNITY): Payer: Self-pay

## 2023-10-08 MED ORDER — ROSUVASTATIN CALCIUM 5 MG PO TABS
5.0000 mg | ORAL_TABLET | Freq: Every evening | ORAL | 0 refills | Status: AC
  Filled 2023-10-08: qty 90, 90d supply, fill #0

## 2023-10-10 ENCOUNTER — Other Ambulatory Visit (HOSPITAL_COMMUNITY): Payer: Self-pay

## 2023-10-10 MED ORDER — MELOXICAM 15 MG PO TABS
15.0000 mg | ORAL_TABLET | Freq: Every day | ORAL | 0 refills | Status: DC | PRN
  Filled 2023-10-10: qty 10, 10d supply, fill #0

## 2023-11-08 DIAGNOSIS — R972 Elevated prostate specific antigen [PSA]: Secondary | ICD-10-CM | POA: Diagnosis not present

## 2023-11-15 ENCOUNTER — Other Ambulatory Visit (HOSPITAL_COMMUNITY): Payer: Self-pay

## 2023-11-15 DIAGNOSIS — R102 Pelvic and perineal pain: Secondary | ICD-10-CM | POA: Diagnosis not present

## 2023-11-15 DIAGNOSIS — R972 Elevated prostate specific antigen [PSA]: Secondary | ICD-10-CM | POA: Diagnosis not present

## 2023-11-15 MED ORDER — MELOXICAM 15 MG PO TABS
15.0000 mg | ORAL_TABLET | Freq: Every day | ORAL | 1 refills | Status: AC | PRN
  Filled 2023-11-15: qty 30, 30d supply, fill #0
  Filled 2023-12-18: qty 30, 30d supply, fill #1

## 2024-01-06 DIAGNOSIS — Z23 Encounter for immunization: Secondary | ICD-10-CM | POA: Diagnosis not present

## 2024-01-06 DIAGNOSIS — R911 Solitary pulmonary nodule: Secondary | ICD-10-CM | POA: Diagnosis not present

## 2024-01-06 DIAGNOSIS — F32A Depression, unspecified: Secondary | ICD-10-CM | POA: Diagnosis not present

## 2024-01-06 DIAGNOSIS — J439 Emphysema, unspecified: Secondary | ICD-10-CM | POA: Diagnosis not present

## 2024-01-06 DIAGNOSIS — N401 Enlarged prostate with lower urinary tract symptoms: Secondary | ICD-10-CM | POA: Diagnosis not present

## 2024-01-06 DIAGNOSIS — F17200 Nicotine dependence, unspecified, uncomplicated: Secondary | ICD-10-CM | POA: Diagnosis not present

## 2024-01-06 DIAGNOSIS — I2510 Atherosclerotic heart disease of native coronary artery without angina pectoris: Secondary | ICD-10-CM | POA: Diagnosis not present

## 2024-01-06 DIAGNOSIS — N138 Other obstructive and reflux uropathy: Secondary | ICD-10-CM | POA: Diagnosis not present

## 2024-01-06 DIAGNOSIS — K7689 Other specified diseases of liver: Secondary | ICD-10-CM | POA: Diagnosis not present

## 2024-01-15 ENCOUNTER — Other Ambulatory Visit (HOSPITAL_COMMUNITY): Payer: Self-pay

## 2024-01-28 DIAGNOSIS — M545 Low back pain, unspecified: Secondary | ICD-10-CM | POA: Diagnosis not present

## 2024-02-13 DIAGNOSIS — H524 Presbyopia: Secondary | ICD-10-CM | POA: Diagnosis not present

## 2024-02-18 ENCOUNTER — Other Ambulatory Visit (HOSPITAL_COMMUNITY): Payer: Self-pay

## 2024-02-18 DIAGNOSIS — R972 Elevated prostate specific antigen [PSA]: Secondary | ICD-10-CM | POA: Diagnosis not present

## 2024-02-18 DIAGNOSIS — N528 Other male erectile dysfunction: Secondary | ICD-10-CM | POA: Diagnosis not present

## 2024-02-18 MED ORDER — TADALAFIL 20 MG PO TABS
10.0000 mg | ORAL_TABLET | Freq: Every day | ORAL | 5 refills | Status: AC | PRN
  Filled 2024-02-18: qty 10, 10d supply, fill #0

## 2024-02-18 MED ORDER — LEVOFLOXACIN 750 MG PO TABS
750.0000 mg | ORAL_TABLET | Freq: Once | ORAL | 0 refills | Status: AC
  Filled 2024-02-18: qty 1, 1d supply, fill #0

## 2024-02-19 ENCOUNTER — Other Ambulatory Visit (HOSPITAL_COMMUNITY): Payer: Self-pay

## 2024-04-16 ENCOUNTER — Other Ambulatory Visit (HOSPITAL_COMMUNITY): Payer: Self-pay | Admitting: Urology

## 2024-04-16 DIAGNOSIS — C61 Malignant neoplasm of prostate: Secondary | ICD-10-CM

## 2024-04-30 ENCOUNTER — Encounter (HOSPITAL_COMMUNITY)

## 2024-05-12 ENCOUNTER — Ambulatory Visit: Admitting: Radiation Oncology
